# Patient Record
Sex: Male | Born: 1970
Health system: Southern US, Community
[De-identification: ages and names within clinical notes are randomized; demographics above are authoritative.]

## PROBLEM LIST (undated history)

## (undated) DIAGNOSIS — E785 Hyperlipidemia, unspecified: Secondary | ICD-10-CM

## (undated) DIAGNOSIS — E781 Pure hyperglyceridemia: Secondary | ICD-10-CM

## (undated) DIAGNOSIS — R7303 Prediabetes: Secondary | ICD-10-CM

## (undated) DIAGNOSIS — R41840 Attention and concentration deficit: Secondary | ICD-10-CM

## (undated) DIAGNOSIS — E079 Disorder of thyroid, unspecified: Secondary | ICD-10-CM

## (undated) HISTORY — DX: Pure hyperglyceridemia: E78.1

## (undated) HISTORY — DX: Attention and concentration deficit: R41.840

## (undated) HISTORY — PX: VASECTOMY: SHX75

## (undated) HISTORY — DX: Hyperlipidemia, unspecified: E78.5

## (undated) HISTORY — PX: CLOSED REDUCTION RADIAL / ULNAR SHAFT FRACTURE: SUR237

## (undated) HISTORY — DX: Prediabetes: R73.03

---

## 2012-01-27 ENCOUNTER — Ambulatory Visit (INDEPENDENT_AMBULATORY_CARE_PROVIDER_SITE_OTHER): Payer: BC Managed Care – PPO | Admitting: Family Medicine

## 2012-01-27 ENCOUNTER — Encounter: Payer: Self-pay | Admitting: Family Medicine

## 2012-01-27 VITALS — BP 120/78 | HR 82 | Ht 70.0 in | Wt 217.0 lb

## 2012-01-27 DIAGNOSIS — Z13220 Encounter for screening for lipoid disorders: Secondary | ICD-10-CM

## 2012-01-27 DIAGNOSIS — Z131 Encounter for screening for diabetes mellitus: Secondary | ICD-10-CM

## 2012-01-27 DIAGNOSIS — R41840 Attention and concentration deficit: Secondary | ICD-10-CM

## 2012-01-27 HISTORY — DX: Attention and concentration deficit: R41.840

## 2012-01-27 NOTE — Progress Notes (Signed)
CC: Ricky Ortiz is a 41 y.o. male is here for Establish Care, concerned about ADD and Labs Only   Subjective: HPI:  Patient presents to establish care and complains about concentration difficulty for the last months. Occurs at work where he often forgets majority of what was discussed during meetings and business calls. Denies any struggling with completing tasks or not keeping up her job responsibilities. Wife informs me that forgetfulness at home seems to be more common now been months ago. He has never had trouble with concentration the past.  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 So the concentration difficulties coincided with the passing of a good friend and mentor whose position the patient replaced, this death occurred around 5-6 months ago. Also has caused him to have more job responsibilities and rolls at work. He denies depression, anxiety, nor mental disturbance. He gets 7 hours of sleep a night and feels well rested in the morning, wife denies him ever stopping breathing or apnea-like symptoms. No alcohol or drug use. He has a history of thyroid dysfunction it was once overactive and then years later at the lower limit of normal. He denies low libido or erectile dysfunction  He believes been well over 2 years since he was screened for cholesterol or diabetes. He is told by his ophthalmologist that there retinal artery plaques  in his eyes. He denies shortness of breath, chest pain, orthopnea, peripheral edema, irregular heartbeat, nor calf claudication.  Review Of Systems Outlined In HPI  History reviewed. No pertinent past medical history.   Family History  Problem Relation Age of Onset  . Alcohol abuse      grandparents  . Colon cancer      grandfather  . Hypertension      grandfather     History  Substance Use Topics  . Smoking status: Never Smoker   . Smokeless tobacco: Not on file  . Alcohol Use: No     Objective: Filed Vitals:     01/27/12 1616  BP: 120/78  Pulse: 82    General: Alert and Oriented, No Acute Distress HEENT: Pupils equal, round, reactive to light. Conjunctivae clear.  Pink inferior turbinates. Neck supple without palpable lymphadenopathy nor abnormal masses. Lungs: Clear to auscultation bilaterally, no wheezing/ronchi/rales.  Comfortable work of breathing. Good air movement. Cardiac: Regular rate and rhythm. Normal S1/S2.  No murmurs, rubs, nor gallops.   Extremities: No peripheral edema.  Strong peripheral pulses.  Mental Status: No depression, anxiety, nor agitation. Skin: Warm and dry.  Assessment & Plan: Ricky Ortiz was seen today for establish care, concerned about add and labs only.  Diagnoses and associated orders for this visit:  Lack of concentration - B12 - TSH  Lipid screening - Lipid panel  Diabetes mellitus screening - Basic Metabolic Panel (BMET)    Ruling out reversible causes of concentration difficulties above. If normal will consider further cognitive testing.  He'll be scheduling a complete physical exam in excellent 2 weeks, routine blood work as above for this upcoming visit   Return in about 2 weeks (around 02/10/2012).

## 2012-02-03 LAB — BASIC METABOLIC PANEL
BUN: 13 mg/dL (ref 6–23)
Calcium: 9.6 mg/dL (ref 8.4–10.5)
Creat: 1.13 mg/dL (ref 0.50–1.35)
Glucose, Bld: 112 mg/dL — ABNORMAL HIGH (ref 70–99)
Potassium: 4.7 mEq/L (ref 3.5–5.3)

## 2012-02-03 LAB — LIPID PANEL
Cholesterol: 237 mg/dL — ABNORMAL HIGH (ref 0–200)
HDL: 34 mg/dL — ABNORMAL LOW (ref >39)
Total CHOL/HDL Ratio: 7 Ratio
Triglycerides: 257 mg/dL — ABNORMAL HIGH (ref <150)
VLDL: 51 mg/dL — ABNORMAL HIGH (ref 0–40)

## 2012-02-03 LAB — TSH: TSH: 2.04 u[IU]/mL (ref 0.350–4.500)

## 2012-02-04 ENCOUNTER — Encounter: Payer: Self-pay | Admitting: Family Medicine

## 2012-02-04 DIAGNOSIS — R7303 Prediabetes: Secondary | ICD-10-CM

## 2012-02-04 DIAGNOSIS — E781 Pure hyperglyceridemia: Secondary | ICD-10-CM | POA: Insufficient documentation

## 2012-02-04 DIAGNOSIS — E785 Hyperlipidemia, unspecified: Secondary | ICD-10-CM | POA: Insufficient documentation

## 2012-02-04 HISTORY — DX: Hyperlipidemia, unspecified: E78.5

## 2012-02-04 HISTORY — DX: Prediabetes: R73.03

## 2012-02-04 HISTORY — DX: Pure hyperglyceridemia: E78.1

## 2012-02-09 ENCOUNTER — Ambulatory Visit (INDEPENDENT_AMBULATORY_CARE_PROVIDER_SITE_OTHER): Payer: BC Managed Care – PPO | Admitting: Family Medicine

## 2012-02-09 ENCOUNTER — Encounter: Payer: Self-pay | Admitting: Family Medicine

## 2012-02-09 VITALS — BP 118/78 | HR 83 | Ht 70.0 in | Wt 219.0 lb

## 2012-02-09 DIAGNOSIS — R7303 Prediabetes: Secondary | ICD-10-CM

## 2012-02-09 DIAGNOSIS — R41840 Attention and concentration deficit: Secondary | ICD-10-CM

## 2012-02-09 DIAGNOSIS — E781 Pure hyperglyceridemia: Secondary | ICD-10-CM

## 2012-02-09 DIAGNOSIS — F988 Other specified behavioral and emotional disorders with onset usually occurring in childhood and adolescence: Secondary | ICD-10-CM

## 2012-02-09 DIAGNOSIS — E785 Hyperlipidemia, unspecified: Secondary | ICD-10-CM

## 2012-02-09 DIAGNOSIS — Z0000 Encounter for general adult medical examination without abnormal findings: Secondary | ICD-10-CM

## 2012-02-09 DIAGNOSIS — R7309 Other abnormal glucose: Secondary | ICD-10-CM

## 2012-02-09 MED ORDER — ATOMOXETINE HCL 80 MG PO CAPS: 80.0000 mg | ORAL_CAPSULE | Freq: Every day | ORAL | Status: DC

## 2012-02-09 NOTE — Progress Notes (Signed)
CC: Ricky Ortiz is a 41 y.o. male is here for Annual Exam  Colonoscopy: Grandfather with colon cancer age 75, no recent or remote rectal bleeding, we'll screen starting age 52 Prostate: Discussed screening risks/beneifts with patient on 02/09/2012. We'll consider PSA at age 13  Influenza Vaccine: Declined 02/09/2012 Pneumovax: Not indicated Td/Tdap: Received 2010 Zoster: Not indicated  Subjective: HPI:  Patient presents for complete physical exam. Past couple weeks he stressed that the cut out ice cream and fast food from his daily routine. No formal exercise program. Denies tobacco alcohol or drug use.  Denies past medical history of substance abuse or chemical dependency.  Denies anxiety, depression, paranoia.    He would like to start medication if indicated for my suspicion of adult ADD.  His son takes ADD medication, and did not have a good response with the stimulant type, patient would like to steer away from this as initial therapy. He is open to the idea of formal psychologic testing.  Review of Systems - General ROS: negative for - chills, fever, night sweats, weight gain or weight loss Ophthalmic ROS: negative for - decreased vision Psychological ROS: negative for - anxiety or depression ENT ROS: negative for - hearing change, nasal congestion, tinnitus or allergies Hematological and Lymphatic ROS: negative for - bleeding problems, bruising or swollen lymph nodes Breast ROS: negative Respiratory ROS: no cough, shortness of breath, or wheezing Cardiovascular ROS: no chest pain or dyspnea on exertion Gastrointestinal ROS: no abdominal pain, change in bowel habits, or black or bloody stools Genito-Urinary ROS: negative for - genital discharge, genital ulcers, incontinence or abnormal bleeding from genitals Musculoskeletal ROS: negative for - joint pain or muscle pain Neurological ROS: negative for - headaches or memory loss Dermatological ROS: negative for lumps, mole changes,  rash and skin lesion changes  Past Medical History  Diagnosis Date  . Lack of concentration 01/27/2012  . Hypertriglyceridemia 02/04/2012  . Pre-diabetes 02/04/2012  . Hyperlipidemia LDL goal < 160 02/04/2012     Family History  Problem Relation Age of Onset  . Alcohol abuse      grandparents  . Colon cancer      grandfather  . Hypertension      grandfather     History  Substance Use Topics  . Smoking status: Never Smoker   . Smokeless tobacco: Not on file  . Alcohol Use: No     Objective: Filed Vitals:   02/09/12 0907  BP: 118/78  Pulse: 83    General: No Acute Distress HEENT: Atraumatic, normocephalic, conjunctivae normal without scleral icterus.  No nasal discharge, hearing grossly intact, TMs with good landmarks bilaterally with no middle ear abnormalities, posterior pharynx clear without oral lesions. Neck: Supple, trachea midline, no cervical nor supraclavicular adenopathy. Pulmonary: Clear to auscultation bilaterally without wheezing, rhonchi, nor rales. Cardiac: Regular rate and rhythm.  No murmurs, rubs, nor gallops. No peripheral edema.  2+ peripheral pulses bilaterally. Abdomen: Bowel sounds normal.  No masses.  Non-tender without rebound.  Negative Murphy's sign. GU: Patient declines  MSK: Grossly intact, no signs of weakness.  Full strength throughout upper and lower extremities.  Full ROM in upper and lower extremities.  No midline spinal tenderness. Neuro: Gait unremarkable, CN II-XII grossly intact.  C5-C6 Reflex 2/4 Bilaterally, L4 Reflex 2/4 Bilaterally.  Cerebellar function intact. Skin: No rashes. Psych: Alert and oriented to person/place/time.  Thought process normal. No anxiety/depression.  Assessment & Plan: Ricky Ortiz was seen today for annual exam.  Diagnoses and associated orders  for this visit:  Annual physical exam  Lack of concentration - Ambulatory referral to Psychology  Hyperlipidemia ldl goal <  160  Pre-diabetes  Hypertriglyceridemia  Add (attention deficit disorder) - atomoxetine (STRATTERA) 80 MG capsule; Take 1 capsule (80 mg total) by mouth daily. Sample Pack    We discussed his hyperlipidemia with Framingham ten-year risk factor 6%. He would like to focus on diet and exercise interventions, reluctant to start medication. Discussed elevated fasting glucose in diet and exercise interventions to help reduce his risk of developing diabetes. Healthy lifestyle options were discussed with the patient to promote general health, handout was given again adressing these topics. He was given a sample pack of Strattera, he will return in 3-4 weeks to determine any improvement of concentration. ADD evaluation referral placed.  Return in about 4 weeks (around 03/08/2012).

## 2012-02-09 NOTE — Patient Instructions (Addendum)
Dr. Norlan Rann's General Advice Following Your Complete Physical Exam  The Benefits of Regular Exercise: Unless you suffer from an uncontrolled cardiovascular condition, studies strongly suggest that regular exercise and physical activity will add to both the quality and length of your life.  The World Health Organization recommends 150 minutes of moderate intensity aerobic activity every week.  This is best split over 3-4 days a week, and can be as simple as a brisk walk for just over 35 minutes "most days of the week".  This type of exercise has been shown to lower LDL-Cholesterol, lower average blood sugars, lower blood pressure, lower cardiovascular disease risk, improve memory, and increase one's overall sense of wellbeing.  The addition of anaerobic (or "strength training") exercises offers additional benefits including but not limited to increased metabolism, prevention of osteoporosis, and improved overall cholesterol levels.  How Can I Strive For A Low-Fat Diet?: Current guidelines recommend that 25-35 percent of your daily energy (food) intake should come from fats.  One might ask how can this be achieved without having to dissect each meal on a daily basis?  Switch to skim or 1% milk instead of whole milk.  Focus on lean meats such as ground turkey, fresh fish, baked chicken, and lean cuts of beef as your source of dietary protein.  Consume less than 300mg/day of dietary cholesterol.  Limit trans fatty acid consumption primarily by limiting synthetic trans fats such as partially hydrogenated oils (Ex: fried fast foods).  Focus efforts on reducing your intake of "solid" fats (Ex: Butter).  Substitute olive or vegetable oil for solid fats where possible.  Moderation of Salt Intake: Provided you don't carry a diagnosis of congestive heart failure nor renal failure, I recommend a daily allowance of no more than 2300 mg of salt (sodium).  Keeping under this daily goal is associated with a  decreased risk of cardiovascular events, creeping above it can lead to elevated blood pressures and increases your risk of cardiovascular events.  Milligrams (mg) of salt is listed on all nutrition labels, and your daily intake can add up faster than you think.  Most canned and frozen dinners can pack in over half your daily salt allowance in one meal.    Lifestyle Health Risks: Certain lifestyle choices carry specific health risks.  As you may already know, tobacco use has been associated with increasing one's risk of cardiovascular disease, pulmonary disease, numerous cancers, among many other issues.  What you may not know is that there are medications and nicotine replacement strategies that can more than double your chances of successfully quitting.  I would be thrilled to help manage your quitting strategy if you currently use tobacco products.  When it comes to alcohol use, I've yet to find an "ideal" daily allowance.  Provided an individual does not have a medical condition that is exacerbated by alcohol consumption, general guidelines determine "safe drinking" as no more than two standard drinks for a man or no more than one standard drink for a male per day.  However, much debate still exists on whether any amount of alcohol consumption is technically "safe".  My general advice, keep alcohol consumption to a minimum for general health promotion.  If you or others believe that alcohol, tobacco, or recreational drug use is interfering with your life, I would be happy to provide confidential counseling regarding treatment options.  General "Over The Counter" Nutrition Advice: Postmenopausal women should aim for a daily calcium intake of 1200 mg, however a significant   portion of this might already be provided by diets including milk, yogurt, cheese, and other dairy products.  Vitamin D has been shown to help preserve bone density, prevent fatigue, and has even been shown to help reduce falls in the  elderly.  Ensuring a daily intake of 800 Units of Vitamin D is a good place to start to enjoy the above benefits, we can easily check your Vitamin D level to see if you'd potentially benefit from supplementation beyond 800 Units a day.  Folic Acid intake should be of particular concern to women of childbearing age.  Daily consumption of 400-800 mcg of Folic Acid is recommended to minimize the chance of spinal cord defects in a fetus should pregnancy occur.    For many adults, accidents still remain one of the most common culprits when it comes to cause of death.  Some of the simplest but most effective preventitive habits you can adopt include regular seatbelt use, proper helmet use, securing firearms, and regularly testing your smoke and carbon monoxide detectors.  Ricky Ortiz B. Ricky Bellis DO [facility] [address], [address] [place], [place] [address] Phone: [phone]  

## 2014-12-19 ENCOUNTER — Encounter: Payer: Self-pay | Admitting: *Deleted

## 2014-12-19 ENCOUNTER — Emergency Department (INDEPENDENT_AMBULATORY_CARE_PROVIDER_SITE_OTHER): Payer: BLUE CROSS/BLUE SHIELD

## 2014-12-19 ENCOUNTER — Emergency Department (INDEPENDENT_AMBULATORY_CARE_PROVIDER_SITE_OTHER)
Admission: EM | Admit: 2014-12-19 | Discharge: 2014-12-19 | Disposition: A | Discharge status: Home / Self Care | Payer: BLUE CROSS/BLUE SHIELD | Source: Home / Self Care | Attending: Family Medicine | Admitting: Family Medicine

## 2014-12-19 DIAGNOSIS — S62396A Other fracture of fifth metacarpal bone, right hand, initial encounter for closed fracture: Secondary | ICD-10-CM

## 2014-12-19 DIAGNOSIS — X58XXXA Exposure to other specified factors, initial encounter: Secondary | ICD-10-CM | POA: Diagnosis not present

## 2014-12-19 DIAGNOSIS — S62306A Unspecified fracture of fifth metacarpal bone, right hand, initial encounter for closed fracture: Secondary | ICD-10-CM | POA: Diagnosis not present

## 2014-12-19 MED ORDER — TRAMADOL HCL 50 MG PO TABS: ORAL_TABLET | ORAL | Status: DC

## 2014-12-19 NOTE — Discharge Instructions (Signed)
Boxer's Fracture °Boxer's fracture is a broken bone (fracture) of the fourth or fifth metacarpal (ring or pinky finger). The metacarpal bones connect the wrist to the fingers and make up the arch of the hand. Boxer's fracture occurs toward the body (proximal) from the first knuckle. This injury is known as a boxer's fracture, because it often occurs from hitting an object with a closed fist. °SYMPTOMS  °· Severe pain at the time of injury. °· Pain and swelling around the first knuckle on the fourth or fifth finger. °· Bruising (contusion) in the area within 48 hours of injury. °· Visible deformity, such as a pushed down knuckle. This can occur if the fracture is complete, and the bone fragments separate enough to distort normal body shape. °· Numbness or paralysis from swelling in the hand, causing pressure on the blood vessels or nerves (uncommon). °CAUSES  °· Direct injury (trauma), such as a striking blow with the fist. °· Indirect stress to the hand, such as twisting or violent muscle contraction (uncommon). °RISK INCREASES WITH: °· Punching an object with an unprotected knuckle. °· Contact sports (football, rugby). °· Sports that require hitting (boxing, martial arts). °· History of bone or joint disease (osteoporosis). °PREVENTION °· Maintain physical fitness: °¨ Muscle strength and flexibility. °¨ Endurance. °¨ Cardiovascular fitness. °· For participation in contact sports, wear proper protective equipment for the hand and make sure it fits properly. °· Learn and use proper technique when hitting or punching. °PROGNOSIS  °When proper treatment is given, to ensure normal alignment of the bones, healing can usually be expected in 4 to 6 weeks. Occasionally, surgery is necessary.  °RELATED COMPLICATIONS  °· Bone does not heal back together (nonunion). °· Bone heals together in an improper position (malunion), causing twisting of the finger when making a fist. °· Chronic pain, stiffness, or swelling of the  hand. °· Excessive bleeding in the hand, causing pressure and injury to nerves and blood vessels (rare). °· Stopping of normal hand growth in children. °· Infection of the wound, if skin is broken over the fracture (open fracture), or at the incision site if surgery is performed. °· Shortening of injured bones. °· Bony bump (prominence) in the palm or loss of shape of the knuckles. °· Pain and weakness when gripping. °· Arthritis of the affected joint, if the fracture goes into the joint, after repeated injury, or after delayed treatment. °· Scarring around the knuckle, and limited motion. °TREATMENT  °Treatment varies, depending on the injury. The place in the hand where the injury occurs has a great deal of motion, which allows the hand to move properly. If the fracture is not aligned properly, this function may be decreased. If the bone ends are in proper alignment, treatment first involves ice and elevation of the injured hand, at or above heart level, to reduce inflammation. Pain medicines help to relieve pain. Treatment also involves restraint by splinting, bandaging, casting, or bracing for 4 or more weeks.  °If the fracture is out of alignment (displaced), or it involves the joint, surgery is usually recommended. Surgery typically involves cutting through the skin to place removable pins, screws, and sometimes plates over the fracture. After surgery, the bone and joint are restrained for 4 or more weeks. After restraint (with or without surgery), stretching and strengthening exercises are needed to regain proper strength and function of the joint. Exercises may be done at home or with the assistance of a therapist. Depending on the sport and position played, a   brace or splint may be recommended when first returning to sports.  °MEDICATION  °· If pain medication is necessary, nonsteroidal anti-inflammatory medications, such as aspirin and ibuprofen, or other minor pain relievers, such as acetaminophen, are  often recommended. °· Do not take pain medication for 7 days before surgery. °· Prescription pain relievers may be necessary. Use only as directed and only as much as you need. °COLD THERAPY °Cold treatment (icing) relieves pain and reduces inflammation. Cold treatment should be applied for 10 to 15 minutes every 2 to 3 hours for inflammation and pain, and immediately after any activity that aggravates your symptoms. Use ice packs or an ice massage. °SEEK MEDICAL CARE IF:  °· Pain, tenderness, or swelling gets worse, despite treatment. °· You experience pain, numbness, or coldness in the hand. °· Blue, gray, or dark color appears in the fingernails. °· You develop signs of infection, after surgery (fever, increased pain, swelling, redness, drainage of fluids, or bleeding in the surgical area). °· You feel you have reinjured the hand. °· New, unexplained symptoms develop. (Drugs used in treatment may produce side effects.) °Document Released: 03/03/2005 Document Revised: 05/26/2011 Document Reviewed: 06/15/2008 °ExitCare® Patient Information ©2015 [employer]. This information is not intended to replace advice given to you by your health care provider. Make sure you discuss any questions you have with your health care provider. ° °

## 2014-12-19 NOTE — Consult Note (Signed)
   Subjective:    I'm seeing this patient as a consultation for:  Dr. Donna Christen  CC: Right hand fracture  HPI: 3 days ago this pleasant 44 year old male smashed his hand on a table, he had immediate pain, swelling, bruising, he did not seek immediate medical attention however when pain persisted, he was seen by the urgent care provider, x-rays revealed and a dilated fracture of the fifth metacarpal and I was called for further evaluation and definitive management. Pain is moderate, persistent without radiation.  Past medical history, Surgical history, Family history not pertinant except as noted below, Social history, Allergies, and medications have been entered into the medical record, reviewed, and no changes needed.   Review of Systems: No headache, visual changes, nausea, vomiting, diarrhea, constipation, dizziness, abdominal pain, skin rash, fevers, chills, night sweats, weight loss, swollen lymph nodes, body aches, joint swelling, muscle aches, chest pain, shortness of breath, mood changes, visual or auditory hallucinations.   Objective:   General: Well Developed, well nourished, and in no acute distress.  Neuro/Psych: Alert and oriented x3, extra-ocular muscles intact, able to move all 4 extremities, sensation grossly intact. Skin: Warm and dry, no rashes noted.  Respiratory: Not using accessory muscles, speaking in full sentences, trachea midline.  Cardiovascular: Pulses palpable, no extremity edema. Abdomen: Does not appear distended. Right hand: Tender to palpation with obvious deformity at the neck of the fifth metacarpal, neurovascularly intact distally.  X-rays reviewed and show an angulated fracture at the neck of the fifth metacarpal.  Procedure:  Fracture Reduction   Risks, benefits, and alternatives explained and consent obtained. Time out conducted. Surface prepped with alcohol. 4 mL lidocaine, 4 mL Marcaine infiltrated in a hematoma block. Adequate anesthesia  ensured. Fracture reduction: I applied a volarly directed force from the last packed of the fracture, thus reducing the fracture, and ulnar gutter splint was then placed. Post reduction films obtained showed anatomic/near-anatomic alignment. Pt stable, aftercare and follow-up advised.  Impression and Recommendations:   This case required medical decision making of moderate complexity.  1. Angulate it fracture of the right fifth metacarpal, closed reduction as above, patient declines narcotics, he will use tramadol and Tylenol for pain. He needs to see me at the end of the week for repeat x-rays.  I billed a fracture code for this encounter, all subsequent visits will be post-op checks in the global period.  ___________________________________________ Ihor Austin. Benjamin Stain, M.D., ABFM., CAQSM. Primary Care and Sports Medicine [facility]  Adjunct Instructor of Family Medicine  [employer]

## 2014-12-19 NOTE — ED Notes (Signed)
Pt c/o RT hand pain after hitting it on a table x 3 days ago.

## 2014-12-19 NOTE — ED Provider Notes (Signed)
CSN: [id number]     Arrival date & time 12/19/14  1810 History   First MD Initiated Contact with Patient 12/19/14 1841     Chief Complaint  Patient presents with  . Hand Pain      HPI Comments: Three days ago while in [place], patient hit a table with his right hand resulting in pain, swelling, and deformity.  Patient is a 44 y.o. male presenting with hand injury. The history is provided by the patient.  Hand Injury Location:  Hand Time since incident:  3 days Injury: yes   Mechanism of injury comment:  Hit a table top Hand location:  R hand Pain details:    Quality:  Aching   Radiates to:  Does not radiate   Severity:  No pain   Onset quality:  Sudden Chronicity:  New Handedness:  Right-handed Foreign body present:  No foreign bodies Prior injury to area:  No Relieved by:  Nothing Worsened by:  Movement Ineffective treatments:  Ice Associated symptoms: swelling   Associated symptoms: no decreased range of motion, no muscle weakness, no numbness, no stiffness and no tingling     Past Medical History  Diagnosis Date  . Lack of concentration 01/27/2012  . Hypertriglyceridemia 02/04/2012  . Pre-diabetes 02/04/2012  . Hyperlipidemia LDL goal < 160 02/04/2012   Past Surgical History  Procedure Laterality Date  . Vasectomy     Family History  Problem Relation Age of Onset  . Alcohol abuse      grandparents  . Colon cancer      grandfather  . Hypertension      grandfather   Social History  Substance Use Topics  . Smoking status: Never Smoker   . Smokeless tobacco: None  . Alcohol Use: No    Review of Systems  Musculoskeletal: Negative for stiffness.  All other systems reviewed and are negative.   Allergies  Review of patient's allergies indicates no known allergies.  Home Medications   Prior to Admission medications   Medication Sig Start Date End Date Taking? Authorizing Provider  atomoxetine (STRATTERA) 80 MG capsule Take 1 capsule (80 mg total) by  mouth daily. Sample Pack 02/09/12 02/08/13  Laren Boom, DO   Meds Ordered and Administered this Visit  Medications - No data to display  BP 135/89 mmHg  Pulse 93  Temp(Src) 98.1 F (36.7 C) (Oral)  Resp 18  Ht  (1.778 m)  Wt 226 lb (102.513 kg)  BMI 32.43 kg/m2  SpO2 96% No data found.   Physical Exam  Constitutional: He is oriented to person, place, and time. He appears well-developed and well-nourished. No distress.  HENT:  Head: Atraumatic.  Eyes: Pupils are equal, round, and reactive to light.  Pulmonary/Chest: No respiratory distress.  Musculoskeletal:       Right hand: He exhibits tenderness, bony tenderness, deformity and swelling. He exhibits normal range of motion, normal two-point discrimination, normal capillary refill and no laceration. Normal sensation noted. Normal strength noted.       Hands: There is swelling, deformity, and tenderness to palpation over the right fifth distal metacarpal and MCP joint.  Neurological: He is alert and oriented to person, place, and time.  Skin: Skin is warm and dry.  Nursing note and vitals reviewed.   ED Course  Procedures  None   Imaging Review Dg Hand Complete Right  12/19/2014   CLINICAL DATA:  Status post injury to the right hand 4 days ago with pain.  EXAM: RIGHT  HAND - COMPLETE 3+ VIEW  COMPARISON:  None.  FINDINGS: There is comminuted displaced fracture of the distal fifth metacarpal with volar angulation of distal fragment. There is no dislocation.  IMPRESSION: Fracture of fifth metacarpal.   Electronically Signed   By: Sherian Rein M.D.   On: 12/19/2014 18:44     MDM   1. Fracture of fifth metacarpal bone of right hand, closed, initial encounter    Discussed with Dr. Rodney Langton who will assume care for fracture reduction, immobilization, and management    Lattie Haw, MD 12/19/14 [phone]

## 2014-12-22 ENCOUNTER — Ambulatory Visit (INDEPENDENT_AMBULATORY_CARE_PROVIDER_SITE_OTHER): Payer: BLUE CROSS/BLUE SHIELD

## 2014-12-22 ENCOUNTER — Encounter: Payer: Self-pay | Admitting: Sports Medicine

## 2014-12-22 ENCOUNTER — Ambulatory Visit (INDEPENDENT_AMBULATORY_CARE_PROVIDER_SITE_OTHER): Payer: BLUE CROSS/BLUE SHIELD | Admitting: Sports Medicine

## 2014-12-22 VITALS — BP 138/89 | HR 86 | Ht 70.0 in | Wt 226.0 lb

## 2014-12-22 DIAGNOSIS — S62396D Other fracture of fifth metacarpal bone, right hand, subsequent encounter for fracture with routine healing: Secondary | ICD-10-CM

## 2014-12-22 DIAGNOSIS — X58XXXD Exposure to other specified factors, subsequent encounter: Secondary | ICD-10-CM | POA: Diagnosis not present

## 2014-12-22 DIAGNOSIS — S62306A Unspecified fracture of fifth metacarpal bone, right hand, initial encounter for closed fracture: Secondary | ICD-10-CM | POA: Insufficient documentation

## 2014-12-22 DIAGNOSIS — S62306D Unspecified fracture of fifth metacarpal bone, right hand, subsequent encounter for fracture with routine healing: Secondary | ICD-10-CM

## 2014-12-22 NOTE — Progress Notes (Signed)
  Subjective: Ricky Ortiz returns, I performed a closed reduction of an angulated boxer's fracture 3 days ago, he returns here today doing well in the splint, almost pain-free   Objective: General: Well-developed, well-nourished, and in no acute distress. Right hand: Good in the splint, no evidence of skin breakdown. Minimally tender over the fracture.  X-rays personally reviewed, there has been approximately 2 mm loss in length of the fifth metacarpal however there is 0 angulation.  Assessment/plan:

## 2014-12-22 NOTE — Assessment & Plan Note (Signed)
Doing well, good maintained reduction after closed reduction 2 days ago. Continue ulnar gutter splint. Return to see me in one, x-ray before visit.

## 2014-12-29 ENCOUNTER — Ambulatory Visit (INDEPENDENT_AMBULATORY_CARE_PROVIDER_SITE_OTHER): Payer: BLUE CROSS/BLUE SHIELD | Admitting: Sports Medicine

## 2014-12-29 ENCOUNTER — Encounter: Payer: Self-pay | Admitting: Sports Medicine

## 2014-12-29 ENCOUNTER — Ambulatory Visit (INDEPENDENT_AMBULATORY_CARE_PROVIDER_SITE_OTHER): Payer: BLUE CROSS/BLUE SHIELD

## 2014-12-29 DIAGNOSIS — M25741 Osteophyte, right hand: Secondary | ICD-10-CM

## 2014-12-29 DIAGNOSIS — S62306D Unspecified fracture of fifth metacarpal bone, right hand, subsequent encounter for fracture with routine healing: Secondary | ICD-10-CM

## 2014-12-29 DIAGNOSIS — X58XXXD Exposure to other specified factors, subsequent encounter: Secondary | ICD-10-CM

## 2014-12-29 NOTE — Progress Notes (Signed)
  Subjective: 1 week post closed reduction of a right fifth metacarpal fracture, doing well.   Objective: General: Well-developed, well-nourished, and in no acute distress. Right hand: Splint is removed, minimal tenderness over the fracture, neurovascularly intact distally.  Exos cast placed.  X-rays personally reviewed, they are stable with signs of new bone callus, there is no angulation, there is only minimal if any displacement, there has been only a couple millimeter loss of height of the fracture.  Assessment/plan:

## 2014-12-29 NOTE — Assessment & Plan Note (Signed)
Recheck fracture in 3 weeks, x-ray before visit.

## 2015-01-19 ENCOUNTER — Ambulatory Visit: Payer: BLUE CROSS/BLUE SHIELD | Admitting: Sports Medicine

## 2015-01-26 ENCOUNTER — Encounter: Payer: Self-pay | Admitting: Sports Medicine

## 2015-01-26 ENCOUNTER — Ambulatory Visit (INDEPENDENT_AMBULATORY_CARE_PROVIDER_SITE_OTHER): Payer: BLUE CROSS/BLUE SHIELD

## 2015-01-26 ENCOUNTER — Ambulatory Visit (INDEPENDENT_AMBULATORY_CARE_PROVIDER_SITE_OTHER): Payer: BLUE CROSS/BLUE SHIELD | Admitting: Sports Medicine

## 2015-01-26 VITALS — BP 118/86 | HR 95 | Wt 225.0 lb

## 2015-01-26 DIAGNOSIS — S62306D Unspecified fracture of fifth metacarpal bone, right hand, subsequent encounter for fracture with routine healing: Secondary | ICD-10-CM

## 2015-01-26 DIAGNOSIS — X58XXXD Exposure to other specified factors, subsequent encounter: Secondary | ICD-10-CM

## 2015-01-26 DIAGNOSIS — S62397D Other fracture of fifth metacarpal bone, left hand, subsequent encounter for fracture with routine healing: Secondary | ICD-10-CM | POA: Diagnosis not present

## 2015-01-26 NOTE — Progress Notes (Signed)
  Subjective: 4 weeks post fracture of the fifth metacarpal neck on the right, doing well with Exos cast . Pain-free.   Objective: General: Well-developed, well-nourished, and in no acute distress. Right hand: Palpable bony callus, there is minimal depression of the fifth metacarpal head, he has full range of motion, good strength and no rotational deformity, there is no tenderness over the fracture.  X-rays reviewed and show good bony callus, alignment is stable, there is only approximately 2 mm loss of height of the fifth metacarpal head.  Assessment/plan:

## 2015-01-26 NOTE — Assessment & Plan Note (Signed)
Doing extremely well four weeks post fracture. Discontinue cast, return as needed.

## 2015-09-04 ENCOUNTER — Encounter: Payer: Self-pay | Admitting: Sports Medicine

## 2015-09-04 ENCOUNTER — Ambulatory Visit (INDEPENDENT_AMBULATORY_CARE_PROVIDER_SITE_OTHER): Payer: BLUE CROSS/BLUE SHIELD | Admitting: Sports Medicine

## 2015-09-04 VITALS — BP 135/87 | HR 88 | Wt 228.0 lb

## 2015-09-04 DIAGNOSIS — M778 Other enthesopathies, not elsewhere classified: Secondary | ICD-10-CM | POA: Insufficient documentation

## 2015-09-04 DIAGNOSIS — M6588 Other synovitis and tenosynovitis, other site: Secondary | ICD-10-CM | POA: Diagnosis not present

## 2015-09-04 DIAGNOSIS — M779 Enthesopathy, unspecified: Principal | ICD-10-CM

## 2015-09-04 NOTE — Progress Notes (Signed)
   Subjective:    I'm seeing this patient as a consultation for:  Dr. Laren BoomSean Hommel  CC: Right hand pain  HPI: For the past several weeks this pleasant 45 year old male has had pain that he localizes in the palm of his right hand, moderate, persistent, worse with gripping and pressure. No radiation, no numbness or tingling, no trauma. He did have a fifth metacarpal fracture that has healed well.  Past medical history, Surgical history, Family history not pertinant except as noted below, Social history, Allergies, and medications have been entered into the medical record, reviewed, and no changes needed.   Review of Systems: No headache, visual changes, nausea, vomiting, diarrhea, constipation, dizziness, abdominal pain, skin rash, fevers, chills, night sweats, weight loss, swollen lymph nodes, body aches, joint swelling, muscle aches, chest pain, shortness of breath, mood changes, visual or auditory hallucinations.   Objective:   General: Well Developed, well nourished, and in no acute distress.  Neuro/Psych: Alert and oriented x3, extra-ocular muscles intact, able to move all 4 extremities, sensation grossly intact. Skin: Warm and dry, no rashes noted.  Respiratory: Not using accessory muscles, speaking in full sentences, trachea midline.  Cardiovascular: Pulses palpable, no extremity edema. Abdomen: Does not appear distended. Right hand: Tender to palpation at the third flexor digitorum tendon.  Procedure: Real-time Ultrasound Guided Injection of right third flexor digitorum tendon sheath Device: GE Logiq E  Verbal informed consent obtained.  Time-out conducted.  Noted no overlying erythema, induration, or other signs of local infection.  Skin prepped in a sterile fashion.  Local anesthesia: Topical Ethyl chloride.  With sterile technique and under real time ultrasound guidance:  Using a 30-gauge needle I injected 1/2 mL lidocaine, 1/2 mL kenalog 40. Completed without difficulty  Pain  immediately resolved suggesting accurate placement of the medication.  Advised to call if fevers/chills, erythema, induration, drainage, or persistent bleeding.  Images permanently stored and available for review in the ultrasound unit.  Impression: Technically successful ultrasound guided injection.  Impression and Recommendations:   This case required medical decision making of moderate complexity.

## 2015-09-04 NOTE — Assessment & Plan Note (Signed)
Right third flexor digitorum tendinitis, injection placed the tendon sheath, no triggering. Return in one month.

## 2015-10-02 ENCOUNTER — Ambulatory Visit (INDEPENDENT_AMBULATORY_CARE_PROVIDER_SITE_OTHER): Payer: BLUE CROSS/BLUE SHIELD | Admitting: Sports Medicine

## 2015-10-02 ENCOUNTER — Encounter: Payer: Self-pay | Admitting: Sports Medicine

## 2015-10-02 VITALS — BP 118/87 | HR 103 | Wt 229.5 lb

## 2015-10-02 DIAGNOSIS — M779 Enthesopathy, unspecified: Principal | ICD-10-CM

## 2015-10-02 DIAGNOSIS — M6588 Other synovitis and tenosynovitis, other site: Secondary | ICD-10-CM | POA: Diagnosis not present

## 2015-10-02 DIAGNOSIS — M778 Other enthesopathies, not elsewhere classified: Secondary | ICD-10-CM

## 2015-10-02 NOTE — Progress Notes (Signed)
  Subjective:    CC: Follow-up  HPI: Pain-free after her right third flexor tendon sheath injection in the hand, no complaints.  Past medical history, Surgical history, Family history not pertinant except as noted below, Social history, Allergies, and medications have been entered into the medical record, reviewed, and no changes needed.   Review of Systems: No fevers, chills, night sweats, weight loss, chest pain, or shortness of breath.   Objective:    General: Well Developed, well nourished, and in no acute distress.  Neuro: Alert and oriented x3, extra-ocular muscles intact, sensation grossly intact.  HEENT: Normocephalic, atraumatic, pupils equal round reactive to light, neck supple, no masses, no lymphadenopathy, thyroid nonpalpable.  Skin: Warm and dry, no rashes. Cardiac: Regular rate and rhythm, no murmurs rubs or gallops, no lower extremity edema.  Respiratory: Clear to auscultation bilaterally. Not using accessory muscles, speaking in full sentences.  Impression and Recommendations:

## 2015-10-02 NOTE — Assessment & Plan Note (Signed)
Pain-free after her right third flexor tendon sheath injection in the hand, no complaints.

## 2016-01-14 ENCOUNTER — Ambulatory Visit (INDEPENDENT_AMBULATORY_CARE_PROVIDER_SITE_OTHER): Payer: BLUE CROSS/BLUE SHIELD | Admitting: Sports Medicine

## 2016-01-14 ENCOUNTER — Encounter: Payer: Self-pay | Admitting: Sports Medicine

## 2016-01-14 DIAGNOSIS — Z0000 Encounter for general adult medical examination without abnormal findings: Secondary | ICD-10-CM

## 2016-01-14 DIAGNOSIS — R7989 Other specified abnormal findings of blood chemistry: Secondary | ICD-10-CM

## 2016-01-14 DIAGNOSIS — R7303 Prediabetes: Secondary | ICD-10-CM

## 2016-01-14 DIAGNOSIS — E0590 Thyrotoxicosis, unspecified without thyrotoxic crisis or storm: Secondary | ICD-10-CM

## 2016-01-14 DIAGNOSIS — E781 Pure hyperglyceridemia: Secondary | ICD-10-CM

## 2016-01-14 DIAGNOSIS — R946 Abnormal results of thyroid function studies: Secondary | ICD-10-CM

## 2016-01-14 NOTE — Assessment & Plan Note (Signed)
Rechecking A1c. 

## 2016-01-14 NOTE — Progress Notes (Signed)
  Subjective:    CC: Complete physical  HPI:  This is a pleasant 45 year old male with high cholesterol, prediabetes. It's been a long time since he's had blood work and he needs a physical today for employer requirements. Declines influenza vaccination  Past medical history:  Negative.  See flowsheet/record as well for more information.  Surgical history: Negative.  See flowsheet/record as well for more information.  Family history: Negative.  See flowsheet/record as well for more information.  Social history: Negative.  See flowsheet/record as well for more information.  Allergies, and medications have been entered into the medical record, reviewed, and no changes needed.    Review of Systems: No headache, visual changes, nausea, vomiting, diarrhea, constipation, dizziness, abdominal pain, skin rash, fevers, chills, night sweats, swollen lymph nodes, weight loss, chest pain, body aches, joint swelling, muscle aches, shortness of breath, mood changes, visual or auditory hallucinations.  Objective:    General: Well Developed, well nourished, and in no acute distress.  Neuro: Alert and oriented x3, extra-ocular muscles intact, sensation grossly intact. Cranial nerves II through XII are intact, motor, sensory, and coordinative functions are all intact. HEENT: Normocephalic, atraumatic, pupils equal round reactive to light, neck supple, no masses, no lymphadenopathy, thyroid nonpalpable. Oropharynx, nasopharynx, external ear canals are unremarkable. Skin: Warm and dry, no rashes noted.  Cardiac: Regular rate and rhythm, no murmurs rubs or gallops.  Respiratory: Clear to auscultation bilaterally. Not using accessory muscles, speaking in full sentences.  Abdominal: Soft, nontender, nondistended, positive bowel sounds, no masses, no organomegaly.  Musculoskeletal: Shoulder, elbow, wrist, hip, knee, ankle stable, and with full range of motion.  Impression and Recommendations:    The patient was  counselled, risk factors were discussed, anticipatory guidance given.  Annual physical exam Complete physical as above. Checking testosterone levels. Return in one year, he will establish with one of our other primary providers  Pre-diabetes Rechecking A1c  Hypertriglyceridemia Rechecking lipids

## 2016-01-14 NOTE — Assessment & Plan Note (Signed)
Complete physical as above. Checking testosterone levels. Return in one year, he will establish with one of our other primary providers

## 2016-01-14 NOTE — Assessment & Plan Note (Addendum)
Rechecking lipids.  Cholesterol continues to be unacceptably high, we will continue to work on a low cholesterol diet, exercise, and when the patient is ready we can start the medication.

## 2016-01-30 LAB — CBC
HCT: 43.4 % (ref 38.5–50.0)
Hemoglobin: 14.9 g/dL (ref 13.2–17.1)
MCH: 28.9 pg (ref 27.0–33.0)
MCHC: 34.3 g/dL (ref 32.0–36.0)
MCV: 84.3 fL (ref 80.0–100.0)
MPV: 12.5 fL (ref 7.5–12.5)
Platelets: 197 10*3/uL (ref 140–400)
RBC: 5.15 MIL/uL (ref 4.20–5.80)
RDW: 13.4 % (ref 11.0–15.0)
WBC: 8.1 10*3/uL (ref 3.8–10.8)

## 2016-01-31 DIAGNOSIS — E0500 Thyrotoxicosis with diffuse goiter without thyrotoxic crisis or storm: Secondary | ICD-10-CM | POA: Insufficient documentation

## 2016-01-31 LAB — COMPREHENSIVE METABOLIC PANEL
ALT: 47 U/L — ABNORMAL HIGH (ref 9–46)
AST: 21 U/L (ref 10–40)
Alkaline Phosphatase: 58 U/L (ref 40–115)
BUN: 18 mg/dL (ref 7–25)
Creat: 0.87 mg/dL (ref 0.60–1.35)
Glucose, Bld: 122 mg/dL — ABNORMAL HIGH (ref 65–99)
Potassium: 4.5 mmol/L (ref 3.5–5.3)
Sodium: 138 mmol/L (ref 135–146)
Total Bilirubin: 0.4 mg/dL (ref 0.2–1.2)
Total Protein: 6.8 g/dL (ref 6.1–8.1)

## 2016-01-31 LAB — TESTOSTERONE TOTAL,FREE,BIO, MALES
Albumin: 4.2 g/dL (ref 3.6–5.1)
Sex Hormone Binding: 25 nmol/L (ref 10–50)
Testosterone, Bioavailable: 155.2 ng/dL (ref 110.0–575.0)
Testosterone, Free: 80.6 pg/mL (ref 46.0–224.0)
Testosterone: 470 ng/dL (ref 250–827)

## 2016-01-31 LAB — LIPID PANEL
Cholesterol: 219 mg/dL — ABNORMAL HIGH (ref <200)
HDL: 31 mg/dL — ABNORMAL LOW (ref >40)
LDL Cholesterol: 149 mg/dL — ABNORMAL HIGH (ref <100)
Total CHOL/HDL Ratio: 7.1 ratio — ABNORMAL HIGH (ref <5.0)
Triglycerides: 194 mg/dL — ABNORMAL HIGH (ref <150)
VLDL: 39 mg/dL — ABNORMAL HIGH (ref <30)

## 2016-01-31 LAB — VITAMIN D 25 HYDROXY (VIT D DEFICIENCY, FRACTURES): Vit D, 25-Hydroxy: 17 ng/mL — ABNORMAL LOW (ref 30–100)

## 2016-01-31 LAB — COMPREHENSIVE METABOLIC PANEL WITH GFR
Albumin: 4.2 g/dL (ref 3.6–5.1)
CO2: 24 mmol/L (ref 20–31)
Calcium: 9.6 mg/dL (ref 8.6–10.3)
Chloride: 106 mmol/L (ref 98–110)

## 2016-01-31 LAB — TSH: TSH: <0.01 mIU/L — ABNORMAL LOW (ref 0.40–4.50)

## 2016-01-31 LAB — HIV ANTIBODY (ROUTINE TESTING W REFLEX): HIV 1&2 Ab, 4th Generation: NONREACTIVE

## 2016-01-31 LAB — HEMOGLOBIN A1C
Hgb A1c MFr Bld: 5.9 % — ABNORMAL HIGH (ref <5.7)
Mean Plasma Glucose: 123 mg/dL

## 2016-01-31 MED ORDER — VITAMIN D (ERGOCALCIFEROL) 1.25 MG (50000 UNIT) PO CAPS: 50000.0000 [IU] | ORAL_CAPSULE | ORAL | 0 refills | Status: DC

## 2016-01-31 NOTE — Assessment & Plan Note (Addendum)
Undetectably low. Repeating TSH, T3, T4, also checking thyroglobulin and anti-thyroid antibody levels.  Suspect Graves' disease, referral to endocrinology

## 2016-01-31 NOTE — Addendum Note (Signed)
Addended by: Monica BectonHEKKEKANDAM, THOMAS J on: 01/31/2016 08:59 AM   Modules accepted: Orders

## 2016-02-13 LAB — THYROGLOBULIN ANTIBODY PANEL
Thyroglobulin Ab: 572 IU/mL — ABNORMAL HIGH (ref <2)
Thyroglobulin: 1.1 ng/mL — ABNORMAL LOW
Thyroperoxidase Ab SerPl-aCnc: >900 [IU]/mL — ABNORMAL HIGH (ref <9)

## 2016-02-13 LAB — TSH: TSH: <0.01 mIU/L — ABNORMAL LOW (ref 0.40–4.50)

## 2016-02-13 LAB — T4, FREE: Free T4: 1.9 ng/dL — ABNORMAL HIGH (ref 0.8–1.8)

## 2016-02-13 LAB — T3, FREE: T3, Free: 6.5 pg/mL — ABNORMAL HIGH (ref 2.3–4.2)

## 2016-02-13 NOTE — Addendum Note (Signed)
Addended by: Monica BectonHEKKEKANDAM, THOMAS J on: 02/13/2016 12:20 PM   Modules accepted: Orders

## 2016-02-14 LAB — THYROID STIMULATING IMMUNOGLOBULIN: TSI: <89 %{baseline} (ref <140)

## 2016-02-28 ENCOUNTER — Encounter: Payer: Self-pay | Admitting: Endocrinology

## 2016-02-28 ENCOUNTER — Ambulatory Visit (INDEPENDENT_AMBULATORY_CARE_PROVIDER_SITE_OTHER): Payer: BLUE CROSS/BLUE SHIELD | Admitting: Endocrinology

## 2016-02-28 VITALS — BP 138/86 | HR 94 | Ht 70.0 in | Wt 230.0 lb

## 2016-02-28 DIAGNOSIS — E0590 Thyrotoxicosis, unspecified without thyrotoxic crisis or storm: Secondary | ICD-10-CM | POA: Diagnosis not present

## 2016-02-28 NOTE — Progress Notes (Signed)
Subjective:    Patient ID: Ricky Ortiz, male    DOB: 21-Jan-1971, 44 y.o.   MRN: [id number]  HPI Pt is referred by Dr Benjamin Stain, for hyperthyroidism.  Pt reports he was dx'ed with hyperthyroidism in the 1990's, but he took medication for just a brief time, then stopped.  TSH was normal in 2013.  TFT was then found to be abnormal on a routine physical.  He says it retrospect, he has slight tremor of the hands, and assoc anxiety.  He has never had XRT to the anterior neck, or thyroid surgery.  He has never had thyroid imaging.  He does not consume kelp or any other prescribed or non-prescribed thyroid medication.  He has never been on amiodarone.   Past Medical History:  Diagnosis Date  . Hyperlipidemia LDL goal < 160 02/04/2012  . Hypertriglyceridemia 02/04/2012  . Lack of concentration 01/27/2012  . Pre-diabetes 02/04/2012    Past Surgical History:  Procedure Laterality Date  . VASECTOMY      Social History   Social History  . Marital status: Married    Spouse name: N/A  . Number of children: N/A  . Years of education: N/A   Occupational History  . Not on file.   Social History Main Topics  . Smoking status: Never Smoker  . Smokeless tobacco: Not on file  . Alcohol use No  . Drug use: No  . Sexual activity: Not on file   Other Topics Concern  . Not on file   Social History Narrative  . No narrative on file    No current outpatient prescriptions on file prior to visit.   No current facility-administered medications on file prior to visit.     No Known Allergies  Family History  Problem Relation Age of Onset  . Alcohol abuse      grandparents  . Colon cancer      grandfather  . Hypertension      grandfather  . Hypothyroidism Mother     BP 138/86   Pulse 94   Ht 5\' 10"  (1.778 m)   Wt 230 lb (104.3 kg)   SpO2 96%   BMI 33.00 kg/m     Review of Systems denies weight loss, headache, hoarseness, diplopia, diarrhea, polyuria, excessive diaphoresis,  easy bruising, and rhinorrhea.  Her has difficulty with concentration, muscle weakness, doe, palpitations, and heat intolerance.      Objective:   Physical Exam VS: see vs page GEN: no distress HEAD: head: no deformity eyes: no periorbital swelling, no proptosis external nose and ears are normal mouth: no lesion seen NECK: supple, thyroid is not enlarged CHEST WALL: no deformity LUNGS: clear to auscultation CV: reg rate and rhythm, no murmur ABD: abdomen is soft, nontender.  no hepatosplenomegaly.  not distended.  no hernia MUSCULOSKELETAL: muscle bulk and strength are grossly normal.  no obvious joint swelling.  gait is normal and steady EXTEMITIES: no deformity.  no edema PULSES: no carotid bruit NEURO:  cn 2-12 grossly intact.   readily moves all 4's.  sensation is intact to touch on all 4's.  Slight fine tremor of the hands.   SKIN:  Normal texture and temperature.  No rash or suspicious lesion is visible.   NODES:  None palpable at the neck PSYCH: alert, well-oriented.  Does not appear anxious nor depressed.    I have reviewed outside records, and summarized: Pt was noted to have hyperthyroidism, and referred here.  He was noted to be feeling  well in general.  He was seen for wellness visit.    Lab Results  Component Value Date   TSH <0.01 (L) 02/12/2016      Assessment & Plan:  Hyperthyroidism, due to Grave's Dz, new Patient is advised the following:  Patient Instructions  Please consider the treatment options we discussed, and let me know.       Hyperthyroidism Hyperthyroidism is when the thyroid is too active (overactive). Your thyroid is a large gland that is located in your neck. The thyroid helps to control how your body uses food (metabolism). When your thyroid is overactive, it produces too much of a hormone called thyroxine. What are the causes? Causes of hyperthyroidism may include:  Graves disease. This is when your immune system attacks the thyroid  gland. This is the most common cause.  Inflammation of the thyroid gland.  Tumor in the thyroid gland or somewhere else.  Excessive use of thyroid medicines, including:  Prescription thyroid supplement.  Herbal supplements that mimic thyroid hormones.  Solid or fluid-filled lumps within your thyroid gland (thyroid nodules).  Excessive ingestion of iodine. What increases the risk?  Being male.  Having a family history of thyroid conditions. What are the signs or symptoms? Signs and symptoms of hyperthyroidism may include:  Nervousness.  Inability to tolerate heat.  Unexplained weight loss.  Diarrhea.  Change in the texture of hair or skin.  Heart skipping beats or making extra beats.  Rapid heart rate.  Loss of menstruation.  Shaky hands.  Fatigue.  Restlessness.  Increased appetite.  Sleep problems.  Enlarged thyroid gland or nodules. How is this diagnosed? Diagnosis of hyperthyroidism may include:  Medical history and physical exam.  Blood tests.  Ultrasound tests. How is this treated? Treatment may include:  Medicines to control your thyroid.  Surgery to remove your thyroid.  Radiation therapy. Follow these instructions at home:  Take medicines only as directed by your health care provider.  Do not use any tobacco products, including cigarettes, chewing tobacco, or electronic cigarettes. If you need help quitting, ask your health care provider.  Do not exercise or do physical activity until your health care provider approves.  Keep all follow-up appointments as directed by your health care provider. This is important. Contact a health care provider if:  Your symptoms do not get better with treatment.  You have fever.  You are taking thyroid replacement medicine and you:  Have depression.  Feel mentally and physically slow.  Have weight gain. Get help right away if:  You have decreased alertness or a change in your  awareness.  You have abdominal pain.  You feel dizzy.  You have a rapid heartbeat.  You have an irregular heartbeat. This information is not intended to replace advice given to you by your health care provider. Make sure you discuss any questions you have with your health care provider. Document Released: 03/03/2005 Document Revised: 08/02/2015 Document Reviewed: 07/19/2013 Elsevier Interactive Patient Education  2017 [employer]  Radioiodine (I-131) Therapy for Hyperthyroidism Radioiodine (I-131) therapy is a procedure to treat an overactive thyroid gland (hyperthyroidism). The thyroid is a gland in the neck that uses iodine to help control how the body uses food (metabolism). In this procedure, you swallow a pill or liquid that contains I-131. I-131 is manufactured (synthetic) iodine that gives off radiation. This destroys thyroid cells and reverses hyperthyroidism. Tell a health care provider about:  Any allergies you have.  All medicines you are taking, including vitamins, herbs,  eye drops, creams, and over-the-counter medicines.  Any problems you or family members have had with anesthetic medicines.  Any blood disorders you have.  Any surgeries you have had.  Any medical conditions you have.  Whether you are pregnant, may be pregnant, or have gone through menopause, if this applies.  Whether you currently have children.  Whether you plan to have children in the next 2 years.  Any contact you have with children or pregnant women.  Your travel plans for the next 3 months.  Whether you pass through radiation detectors for work or travel. What are the risks? Generally, this is a safe procedure. However, problems may occur, including:  Damage to other structures or organs, such as the salivary glands. This could lead to dry mouth and loss of taste.  Low sperm count, if this applies. This may lead to temporary infertility.  Sore throat or neck pain. This is  temporary.  Slightlyincreased risk of thyroid cancer.  Nausea or vomiting. What happens before the procedure?  Ask your health care provider about changing or stopping your regular medicines. This is especially important if you are taking diabetes medicines, blood thinners, or thyroid medicines.  Women may be asked to take a pregnancy test.  Women who are breastfeeding should plan to stop at least 6 weeks before the procedure.  Follow instructions from your health care provider about eating or drinking restrictions.  Plan to avoid contact with others for 1 week after your treatment. It is most important to avoid contact with children and pregnant women. To do this, plan to stay home from work, arrange child care, and sleep alone, if these things apply to you.  Plan to drive yourself home after treatment. Do not take public transportation. If you need someone to drive you home, sit as far away from the driver as possible. What happens during the procedure?  You will be given a dose of I-131 to swallow. It may be a pill or a liquid.  Your thyroid gland will absorb the I-131 over the next 3 months. The treatment process will be complete in about 6 months. What happens after the procedure?  You may need to stay in the hospital for 24 hours after your treatment. This depends on the requirements in your state.  Follow instructions from your health care provider about:  How to take care of yourself after the procedure.  How to protect others from exposure to radiation as it leaves your body. This information is not intended to replace advice given to you by your health care provider. Make sure you discuss any questions you have with your health care provider. Document Released: 07/20/2008 Document Revised: 08/07/2015 Document Reviewed: 06/28/2014 Elsevier Interactive Patient Education  2017 [employer]

## 2016-02-28 NOTE — Patient Instructions (Addendum)
Please consider the treatment options we discussed, and let me know.       Hyperthyroidism Hyperthyroidism is when the thyroid is too active (overactive). Your thyroid is a large gland that is located in your neck. The thyroid helps to control how your body uses food (metabolism). When your thyroid is overactive, it produces too much of a hormone called thyroxine. What are the causes? Causes of hyperthyroidism may include:  Graves disease. This is when your immune system attacks the thyroid gland. This is the most common cause.  Inflammation of the thyroid gland.  Tumor in the thyroid gland or somewhere else.  Excessive use of thyroid medicines, including:  Prescription thyroid supplement.  Herbal supplements that mimic thyroid hormones.  Solid or fluid-filled lumps within your thyroid gland (thyroid nodules).  Excessive ingestion of iodine. What increases the risk?  Being male.  Having a family history of thyroid conditions. What are the signs or symptoms? Signs and symptoms of hyperthyroidism may include:  Nervousness.  Inability to tolerate heat.  Unexplained weight loss.  Diarrhea.  Change in the texture of hair or skin.  Heart skipping beats or making extra beats.  Rapid heart rate.  Loss of menstruation.  Shaky hands.  Fatigue.  Restlessness.  Increased appetite.  Sleep problems.  Enlarged thyroid gland or nodules. How is this diagnosed? Diagnosis of hyperthyroidism may include:  Medical history and physical exam.  Blood tests.  Ultrasound tests. How is this treated? Treatment may include:  Medicines to control your thyroid.  Surgery to remove your thyroid.  Radiation therapy. Follow these instructions at home:  Take medicines only as directed by your health care provider.  Do not use any tobacco products, including cigarettes, chewing tobacco, or electronic cigarettes. If you need help quitting, ask your health care  provider.  Do not exercise or do physical activity until your health care provider approves.  Keep all follow-up appointments as directed by your health care provider. This is important. Contact a health care provider if:  Your symptoms do not get better with treatment.  You have fever.  You are taking thyroid replacement medicine and you:  Have depression.  Feel mentally and physically slow.  Have weight gain. Get help right away if:  You have decreased alertness or a change in your awareness.  You have abdominal pain.  You feel dizzy.  You have a rapid heartbeat.  You have an irregular heartbeat. This information is not intended to replace advice given to you by your health care provider. Make sure you discuss any questions you have with your health care provider. Document Released: 03/03/2005 Document Revised: 08/02/2015 Document Reviewed: 07/19/2013 Elsevier Interactive Patient Education  2017 [employer]  Radioiodine (I-131) Therapy for Hyperthyroidism Radioiodine (I-131) therapy is a procedure to treat an overactive thyroid gland (hyperthyroidism). The thyroid is a gland in the neck that uses iodine to help control how the body uses food (metabolism). In this procedure, you swallow a pill or liquid that contains I-131. I-131 is manufactured (synthetic) iodine that gives off radiation. This destroys thyroid cells and reverses hyperthyroidism. Tell a health care provider about:  Any allergies you have.  All medicines you are taking, including vitamins, herbs, eye drops, creams, and over-the-counter medicines.  Any problems you or family members have had with anesthetic medicines.  Any blood disorders you have.  Any surgeries you have had.  Any medical conditions you have.  Whether you are pregnant, may be pregnant, or have gone through menopause, if  this applies.  Whether you currently have children.  Whether you plan to have children in the next 2  years.  Any contact you have with children or pregnant women.  Your travel plans for the next 3 months.  Whether you pass through radiation detectors for work or travel. What are the risks? Generally, this is a safe procedure. However, problems may occur, including:  Damage to other structures or organs, such as the salivary glands. This could lead to dry mouth and loss of taste.  Low sperm count, if this applies. This may lead to temporary infertility.  Sore throat or neck pain. This is temporary.  Slightlyincreased risk of thyroid cancer.  Nausea or vomiting. What happens before the procedure?  Ask your health care provider about changing or stopping your regular medicines. This is especially important if you are taking diabetes medicines, blood thinners, or thyroid medicines.  Women may be asked to take a pregnancy test.  Women who are breastfeeding should plan to stop at least 6 weeks before the procedure.  Follow instructions from your health care provider about eating or drinking restrictions.  Plan to avoid contact with others for 1 week after your treatment. It is most important to avoid contact with children and pregnant women. To do this, plan to stay home from work, arrange child care, and sleep alone, if these things apply to you.  Plan to drive yourself home after treatment. Do not take public transportation. If you need someone to drive you home, sit as far away from the driver as possible. What happens during the procedure?  You will be given a dose of I-131 to swallow. It may be a pill or a liquid.  Your thyroid gland will absorb the I-131 over the next 3 months. The treatment process will be complete in about 6 months. What happens after the procedure?  You may need to stay in the hospital for 24 hours after your treatment. This depends on the requirements in your state.  Follow instructions from your health care provider about:  How to take care of  yourself after the procedure.  How to protect others from exposure to radiation as it leaves your body. This information is not intended to replace advice given to you by your health care provider. Make sure you discuss any questions you have with your health care provider. Document Released: 07/20/2008 Document Revised: 08/07/2015 Document Reviewed: 06/28/2014 Elsevier Interactive Patient Education  2017 [employer]

## 2016-06-21 IMAGING — CR DG HAND COMPLETE 3+V*R*
3 series · 3 of 3 positions shown · non-contrast
Comparison: 12/22/2014

CLINICAL DATA: Followup fifth metacarpal fracture

EXAM:
RIGHT HAND - COMPLETE 3+ VIEW

[hand pa]
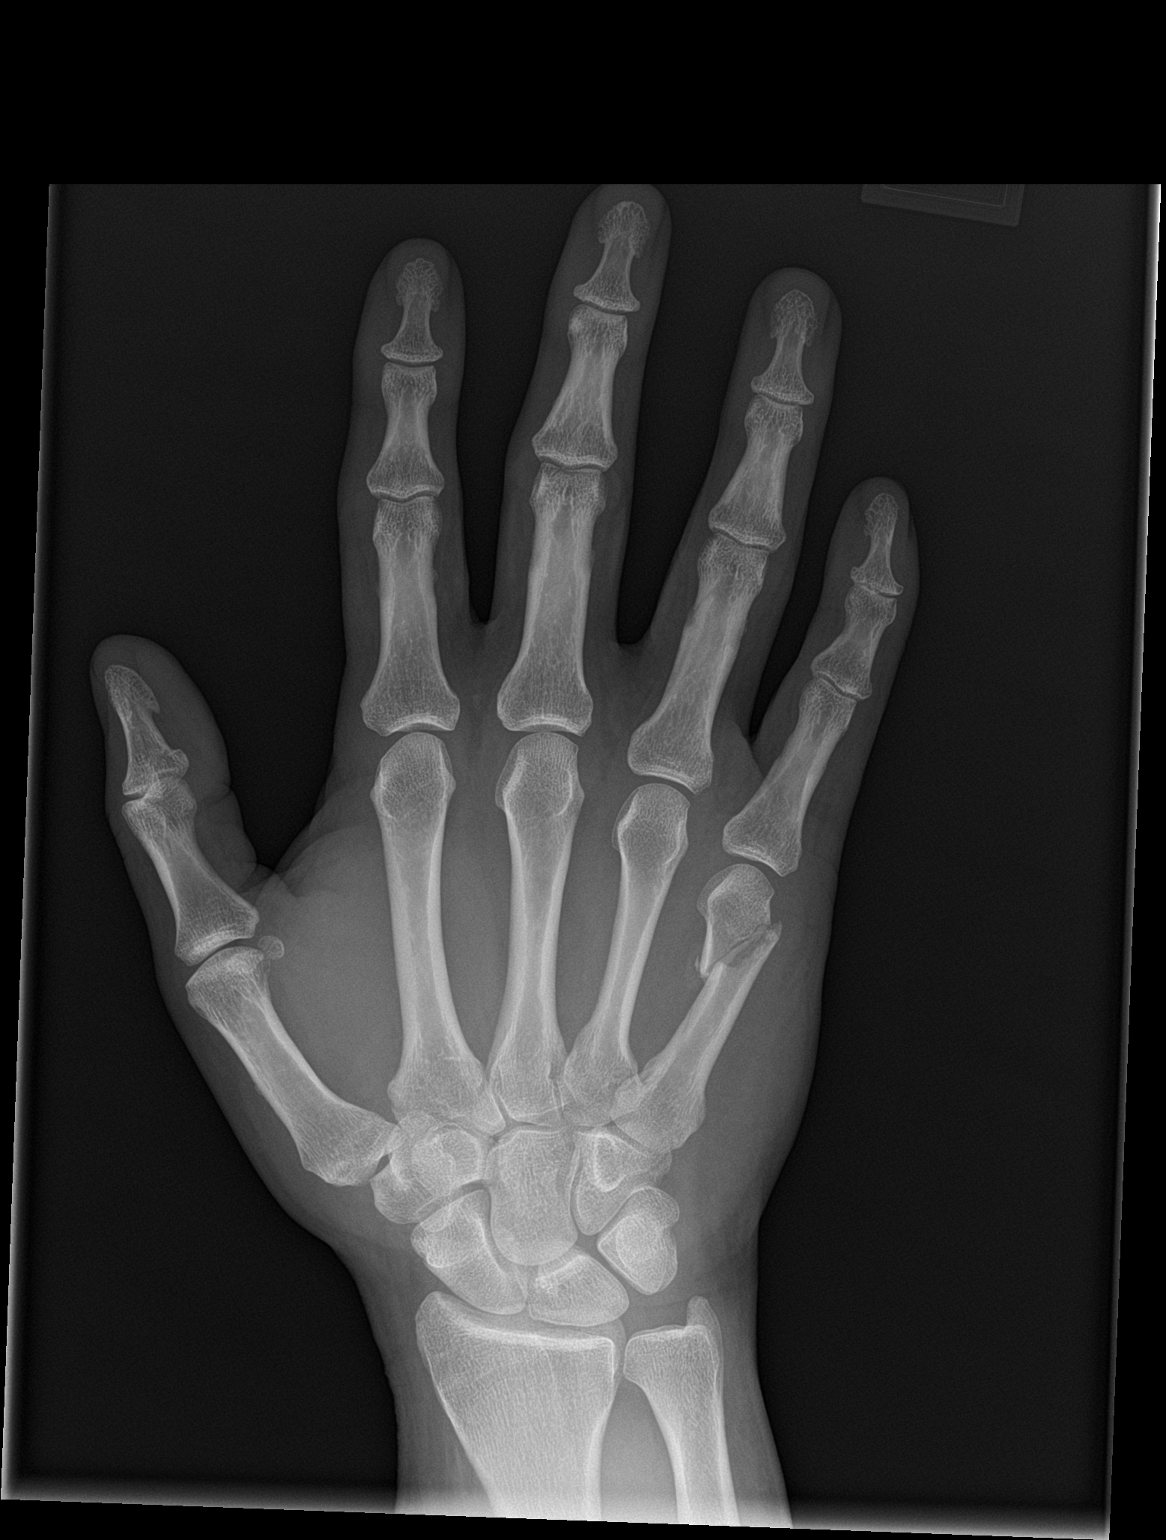

[hand obl]
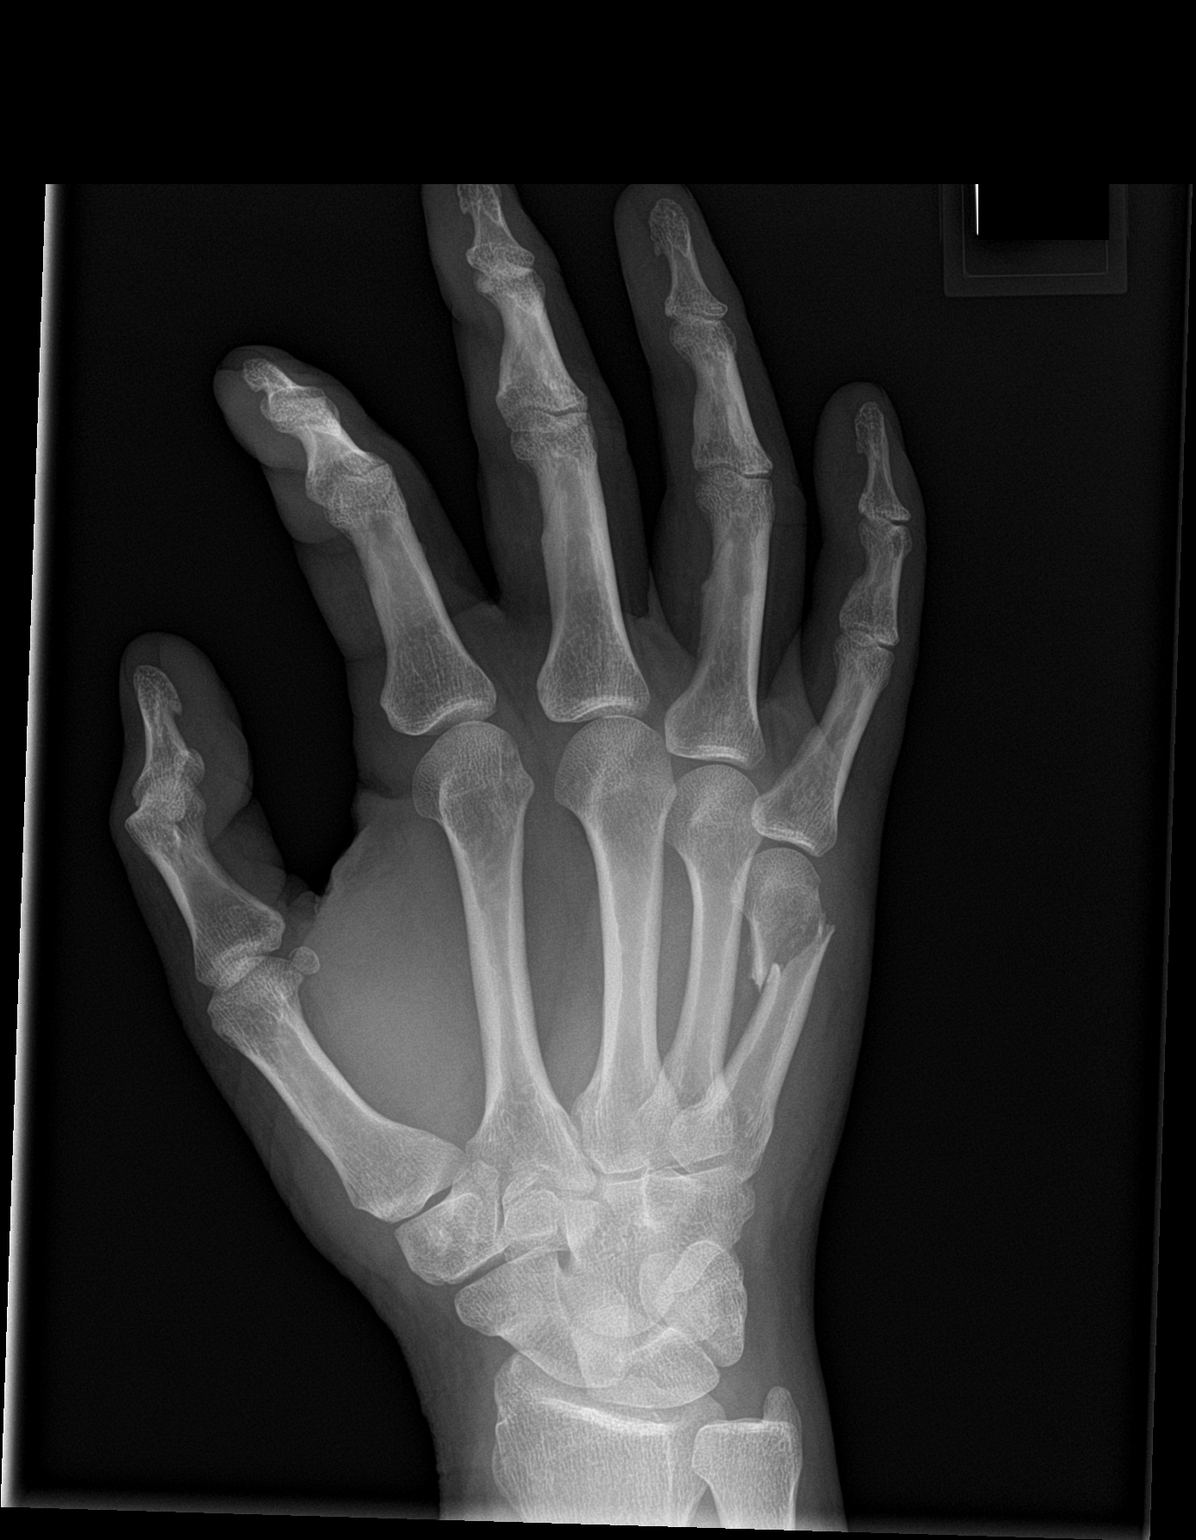

[hand lat]
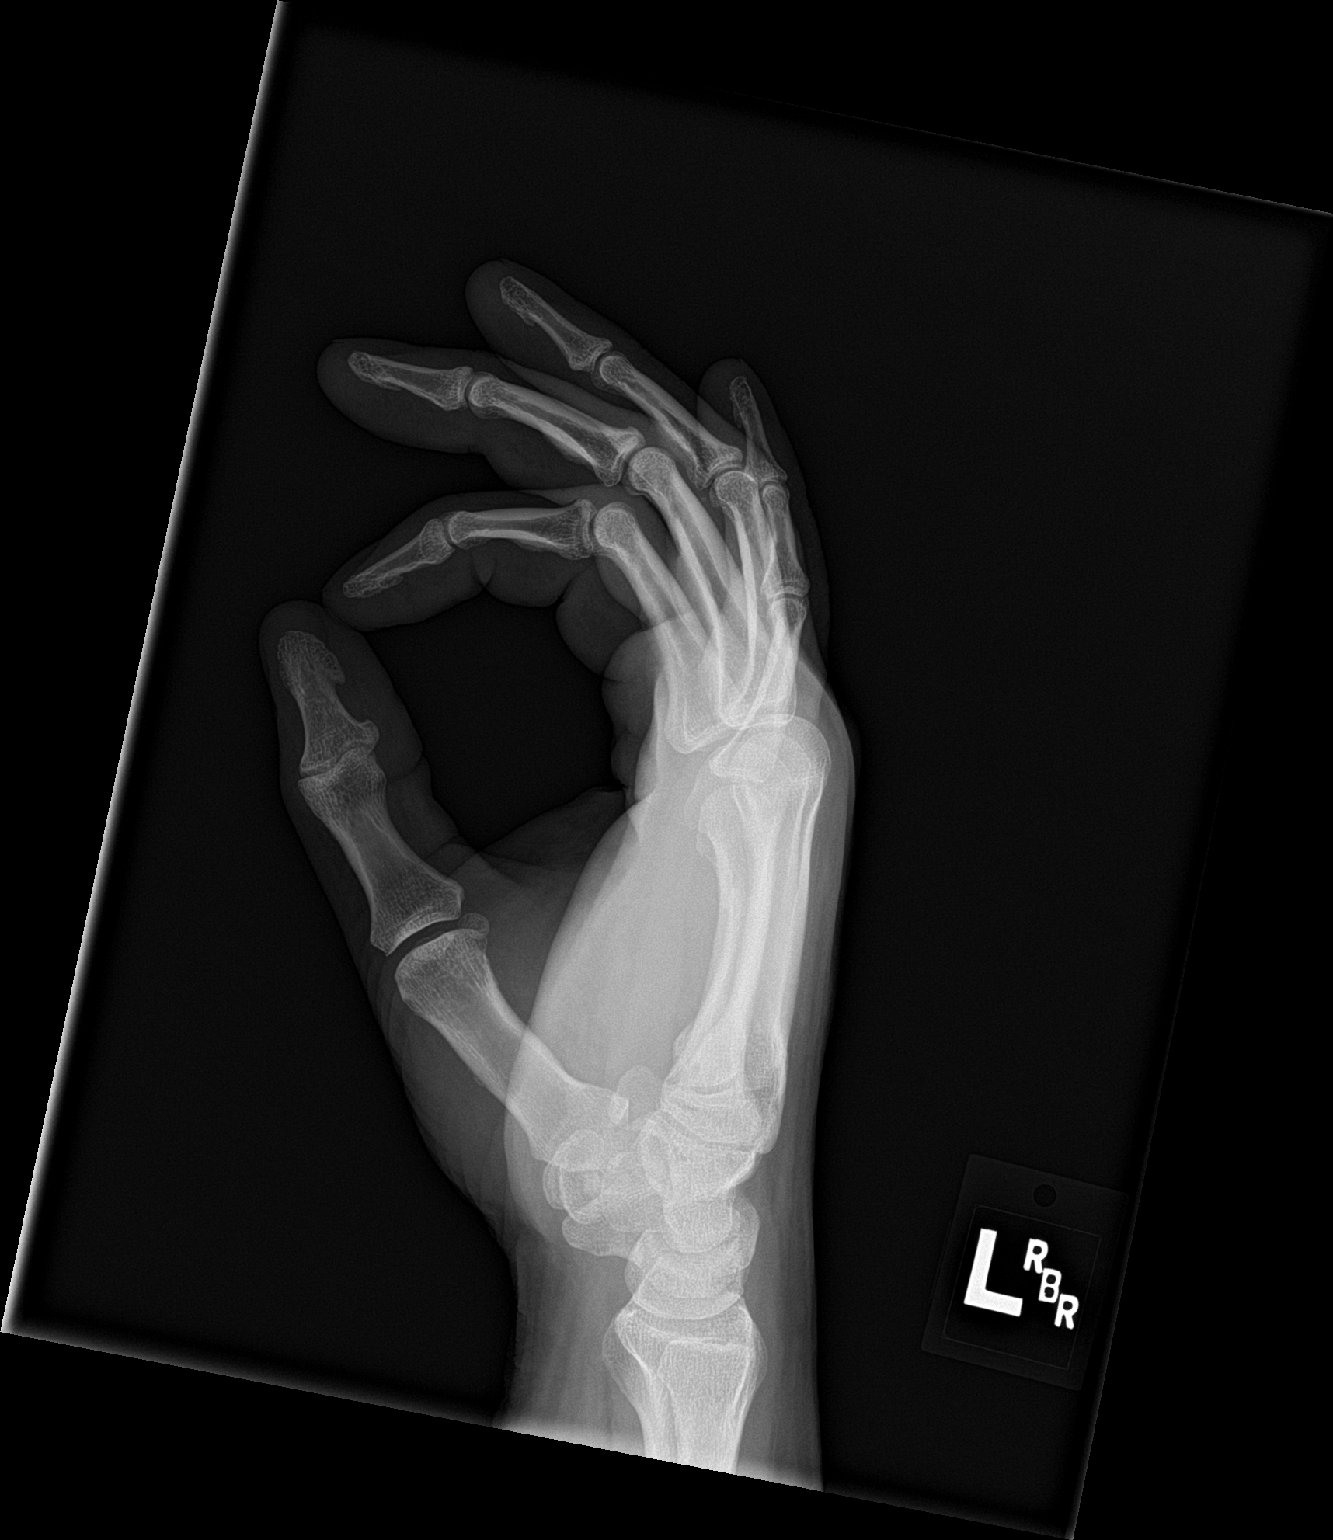

[3 of 3 positions shown; findings below may reference images not displayed]

FINDINGS: Fracture of the distal fifth metacarpal is similar to the prior
exam. There is some minimal callus formation along the radial margin
of the fracture line with no other evidence of healing. The degree
of angulation and minimal displacement are unchanged.

No new fractures.  Joints are normally spaced and aligned.
IMPRESSION: Minimal interval healing with subtle callus formation noted along
the radial margin of the fifth metacarpal fracture. No other change.

## 2016-11-10 ENCOUNTER — Telehealth: Payer: Self-pay | Admitting: Osteopathic Medicine

## 2016-11-10 DIAGNOSIS — Z0000 Encounter for general adult medical examination without abnormal findings: Secondary | ICD-10-CM

## 2016-11-10 DIAGNOSIS — E0590 Thyrotoxicosis, unspecified without thyrotoxic crisis or storm: Secondary | ICD-10-CM

## 2016-11-10 DIAGNOSIS — R7303 Prediabetes: Secondary | ICD-10-CM

## 2016-11-10 DIAGNOSIS — E781 Pure hyperglyceridemia: Secondary | ICD-10-CM

## 2016-11-10 NOTE — Telephone Encounter (Signed)
Patient called req to get lab order for cpe sent down this week pt has cpe for 11/21/16. Thanks

## 2016-11-11 NOTE — Telephone Encounter (Signed)
Labs entered, Pt advised.

## 2016-11-13 LAB — CBC WITH DIFFERENTIAL/PLATELET
Basophils Absolute: 0 cells/uL (ref 0–200)
Basophils Relative: 0 %
EOS PCT: 2 %
Eosinophils Absolute: 170 cells/uL (ref 15–500)
HCT: 44.6 % (ref 38.5–50.0)
Hemoglobin: 15.2 g/dL (ref 13.2–17.1)
LYMPHS PCT: 30 %
Lymphs Abs: 2550 cells/uL (ref 850–3900)
MCH: 29.7 pg (ref 27.0–33.0)
MCHC: 34.1 g/dL (ref 32.0–36.0)
MCV: 87.1 fL (ref 80.0–100.0)
MONO ABS: 680 {cells}/uL (ref 200–950)
MONOS PCT: 8 %
MPV: 11.8 fL (ref 7.5–12.5)
NEUTROS PCT: 60 %
Neutro Abs: 5100 cells/uL (ref 1500–7800)
PLATELETS: 189 10*3/uL (ref 140–400)
RBC: 5.12 MIL/uL (ref 4.20–5.80)
RDW: 13.9 % (ref 11.0–15.0)
WBC: 8.5 10*3/uL (ref 3.8–10.8)

## 2016-11-14 LAB — LIPID PANEL
CHOLESTEROL: 265 mg/dL — AB (ref <200)
HDL: 36 mg/dL — ABNORMAL LOW (ref >40)
LDL CALC: 181 mg/dL — AB (ref <100)
TRIGLYCERIDES: 241 mg/dL — AB (ref <150)
Total CHOL/HDL Ratio: 7.4 Ratio — ABNORMAL HIGH (ref <5.0)
VLDL: 48 mg/dL — ABNORMAL HIGH (ref <30)

## 2016-11-14 LAB — COMPLETE METABOLIC PANEL WITH GFR
ALT: 49 U/L — AB (ref 9–46)
AST: 24 U/L (ref 10–40)
Albumin: 4.7 g/dL (ref 3.6–5.1)
Alkaline Phosphatase: 59 U/L (ref 40–115)
BUN: 21 mg/dL (ref 7–25)
CALCIUM: 9.8 mg/dL (ref 8.6–10.3)
CHLORIDE: 104 mmol/L (ref 98–110)
CO2: 21 mmol/L (ref 20–32)
Creat: 1.13 mg/dL (ref 0.60–1.35)
GFR, Est African American: 89 mL/min (ref >=60)
GFR, Est Non African American: 77 mL/min (ref >=60)
GLUCOSE: 120 mg/dL — AB (ref 65–99)
POTASSIUM: 4.9 mmol/L (ref 3.5–5.3)
SODIUM: 140 mmol/L (ref 135–146)
Total Bilirubin: 0.5 mg/dL (ref 0.2–1.2)
Total Protein: 7.2 g/dL (ref 6.1–8.1)

## 2016-11-14 LAB — TSH: TSH: 1.91 mIU/L (ref 0.40–4.50)

## 2016-11-14 LAB — T4: T4, Total: 7.3 ug/dL (ref 4.9–10.5)

## 2016-11-14 LAB — HEMOGLOBIN A1C
HEMOGLOBIN A1C: 6 % — AB (ref <5.7)
MEAN PLASMA GLUCOSE: 126 mg/dL

## 2016-11-14 LAB — T3: T3 TOTAL: 127.5 ng/dL (ref 76–181)

## 2016-11-21 ENCOUNTER — Encounter: Payer: Self-pay | Admitting: Osteopathic Medicine

## 2016-11-21 ENCOUNTER — Ambulatory Visit (INDEPENDENT_AMBULATORY_CARE_PROVIDER_SITE_OTHER): Payer: Commercial Managed Care - PPO | Admitting: Osteopathic Medicine

## 2016-11-21 VITALS — BP 137/94 | HR 102 | Ht 70.0 in | Wt 234.0 lb

## 2016-11-21 DIAGNOSIS — E0500 Thyrotoxicosis with diffuse goiter without thyrotoxic crisis or storm: Secondary | ICD-10-CM

## 2016-11-21 DIAGNOSIS — R5383 Other fatigue: Secondary | ICD-10-CM | POA: Diagnosis not present

## 2016-11-21 DIAGNOSIS — R7303 Prediabetes: Secondary | ICD-10-CM

## 2016-11-21 DIAGNOSIS — E781 Pure hyperglyceridemia: Secondary | ICD-10-CM | POA: Diagnosis not present

## 2016-11-21 DIAGNOSIS — B351 Tinea unguium: Secondary | ICD-10-CM | POA: Insufficient documentation

## 2016-11-21 DIAGNOSIS — Z0000 Encounter for general adult medical examination without abnormal findings: Secondary | ICD-10-CM

## 2016-11-21 MED ORDER — CICLOPIROX 8 % EX SOLN: Freq: Every day | CUTANEOUS | 0 refills | Status: DC

## 2016-11-21 MED ORDER — TERBINAFINE HCL 250 MG PO TABS: 250.0000 mg | ORAL_TABLET | Freq: Every day | ORAL | 0 refills | Status: DC

## 2016-11-21 NOTE — Progress Notes (Signed)
HPI: Ricky Ortiz is a 46 y.o. male  who presents to [facility] today, 11/21/16,  for chief complaint of:  Chief Complaint  Patient presents with  . Annual Exam    Patient here for annual physical / wellness exam.  See preventive care reviewed as below.  Recent labs reviewed in detail with the patient.   Additional concerns today include:   Fatigue and lack of motivation. Previously tested for testosterone deficiency but his levels were normal. No concerns with depression.   Medical history reviewed since patient is new to me:  Hyperthyroid/Hypothyroid: Last seen by endocrinology 02/2016. Diagnosis of Graves' disease. Patient opted at that time not to treat, just follow-up with lab work. At this point, he is euthyroid based on most recent labs. Antibody levels are of course high last year.  High cholesterol, he has not been diligent about diet/exercise modifications. Reviewed most recent cholesterol labs with him, he would like to hold off on medications for now and feels ready to make changes with diet/exercise       Past medical, surgical, social and family history reviewed:  Patient Active Problem List   Diagnosis Date Noted  . Hyperthyroidism 01/31/2016  . Annual physical exam 01/14/2016  . Right hand tendonitis 09/04/2015  . Closed fracture of fifth metacarpal bone of right hand 12/22/2014  . Pre-diabetes 02/04/2012  . Hypertriglyceridemia 02/04/2012  . Lack of concentration 01/27/2012   Past Surgical History:  Procedure Laterality Date  . VASECTOMY     Social History  Substance Use Topics  . Smoking status: Never Smoker  . Smokeless tobacco: Not on file  . Alcohol use No   Family History  Problem Relation Age of Onset  . Alcohol abuse Unknown        grandparents  . Colon cancer Unknown        grandfather  . Hypertension Unknown        grandfather  . Hypothyroidism Mother      Current medication list and  allergy/intolerance information reviewed:   No current outpatient prescriptions on file.   No current facility-administered medications for this visit.    No Known Allergies    Review of Systems:  Constitutional:  No  fever, no chills, No recent illness, No unintentional weight changes. +significant fatigue.   HEENT: No  headache, no vision change, no hearing change, No sore throat, No  sinus pressure  Cardiac: No  chest pain, No  pressure, +occasional palpitations, No  Orthopnea  Respiratory:  No  shortness of breath. No  Cough  Gastrointestinal: No  abdominal pain, No  nausea, No  vomiting,  No  blood in stool, No  diarrhea, No  constipation   Musculoskeletal: No new myalgia/arthralgia  Genitourinary: No  incontinence, No  abnormal genital bleeding, No abnormal genital discharge  Skin: No  Rash, No other wounds/concerning lesions  Hem/Onc: No  easy bruising/bleeding, No  abnormal lymph node  Endocrine: No cold intolerance,  No heat intolerance. No polyuria/polydipsia/polyphagia   Neurologic: No  weakness, No  dizziness, No  slurred speech/focal weakness/facial droop  Psychiatric: No  concerns with depression, No  concerns with anxiety, No sleep problems, No mood problems  Exam:  BP (!) 137/94   Pulse (!) 102   Ht '5\' 10"'$  (1.778 m)   Wt 234 lb (106.1 kg)   BMI 33.58 kg/m   Constitutional: VS see above. General Appearance: alert, well-developed, well-nourished, NAD  Eyes: Normal lids and conjunctive, non-icteric sclera  Ears, Nose, Mouth, Throat: MMM, Normal external inspection ears/nares/mouth/lips/gums. TM normal bilaterally. Pharynx/tonsils no erythema, no exudate. Nasal mucosa normal.   Neck: No masses, trachea midline. No thyroid enlargement. No tenderness/mass appreciated. No lymphadenopathy  Respiratory: Normal respiratory effort. no wheeze, no rhonchi, no rales  Cardiovascular: S1/S2 normal, no murmur, no rub/gallop auscultated. RRR. No lower extremity  edema.  Gastrointestinal: Nontender, no masses. No hepatomegaly, no splenomegaly. No hernia appreciated. Bowel sounds normal. Rectal exam deferred.   Musculoskeletal: Gait normal. No clubbing/cyanosis of digits.   Neurological: Normal balance/coordination. No tremor.   Skin: warm, dry, intact. No rash/ulcer.   Psychiatric: Normal judgment/insight. Normal mood and affect. Oriented x3.   Recent Results (from the past 2160 hour(s))  CBC with Differential     Status: None   Collection Time: 11/11/16  8:37 AM  Result Value Ref Range   WBC 8.5 3.8 - 10.8 K/uL   RBC 5.12 4.20 - 5.80 MIL/uL   Hemoglobin 15.2 13.2 - 17.1 g/dL   HCT 44.6 38.5 - 50.0 %   MCV 87.1 80.0 - 100.0 fL   MCH 29.7 27.0 - 33.0 pg   MCHC 34.1 32.0 - 36.0 g/dL   RDW 13.9 11.0 - 15.0 %   Platelets 189 140 - 400 K/uL   MPV 11.8 7.5 - 12.5 fL   Neutro Abs 5,100 1,500 - 7,800 cells/uL   Lymphs Abs 2,550 850 - 3,900 cells/uL   Monocytes Absolute 680 200 - 950 cells/uL   Eosinophils Absolute 170 15 - 500 cells/uL   Basophils Absolute 0 0 - 200 cells/uL   Neutrophils Relative % 60 %   Lymphocytes Relative 30 %   Monocytes Relative 8 %   Eosinophils Relative 2 %   Basophils Relative 0 %   Smear Review Criteria for review not met   COMPLETE METABOLIC PANEL WITH GFR     Status: Abnormal   Collection Time: 11/11/16  8:37 AM  Result Value Ref Range   Sodium 140 135 - 146 mmol/L   Potassium 4.9 3.5 - 5.3 mmol/L   Chloride 104 98 - 110 mmol/L   CO2 21 20 - 32 mmol/L    Comment: ** Please note change in reference range(s). **      Glucose, Bld 120 (H) 65 - 99 mg/dL   BUN 21 7 - 25 mg/dL   Creat 1.13 0.60 - 1.35 mg/dL   Total Bilirubin 0.5 0.2 - 1.2 mg/dL   Alkaline Phosphatase 59 40 - 115 U/L   AST 24 10 - 40 U/L   ALT 49 (H) 9 - 46 U/L   Total Protein 7.2 6.1 - 8.1 g/dL   Albumin 4.7 3.6 - 5.1 g/dL   Calcium 9.8 8.6 - 10.3 mg/dL   GFR, Est African American 89 >=60 mL/min   GFR, Est Non African American 77 >=60  mL/min  TSH     Status: None   Collection Time: 11/11/16  8:37 AM  Result Value Ref Range   TSH 1.91 0.40 - 4.50 mIU/L  HgB A1c     Status: Abnormal   Collection Time: 11/11/16  8:37 AM  Result Value Ref Range   Hgb A1c MFr Bld 6.0 (H) <5.7 %    Comment:   For someone without known diabetes, a hemoglobin A1c value between 5.7% and 6.4% is consistent with prediabetes and should be confirmed with a follow-up test.   For someone with known diabetes, a value <7% indicates that their diabetes is well controlled. A1c  targets should be individualized based on duration of diabetes, age, co-morbid conditions and other considerations.   This assay result is consistent with an increased risk of diabetes.   Currently, no consensus exists regarding use of hemoglobin A1c for diagnosis of diabetes in children.      Mean Plasma Glucose 126 mg/dL  T3     Status: None   Collection Time: 11/11/16  8:37 AM  Result Value Ref Range   T3, Total 127.5 76 - 181 ng/dL  T4     Status: None   Collection Time: 11/11/16  8:37 AM  Result Value Ref Range   T4, Total 7.3 4.9 - 10.5 ug/dL    Comment: ** Please note change in reference range(s). **     Lipid Profile     Status: Abnormal   Collection Time: 11/11/16  8:37 AM  Result Value Ref Range   Cholesterol 265 (H) <200 mg/dL   Triglycerides 241 (H) <150 mg/dL   HDL 36 (L) >40 mg/dL   Total CHOL/HDL Ratio 7.4 (H) <5.0 Ratio   VLDL 48 (H) <30 mg/dL   LDL Cholesterol 181 (H) <100 mg/dL     ASSESSMENT/PLAN:    Annual physical exam  Pre-diabetes - Discussed lifestyle modifications  Hypertriglyceridemia - Consider statin therapy, I still modifications. Patient would like to avoid medications - Plan: Lipid panel  Fatigue, unspecified type - Plan: COMPLETE METABOLIC PANEL WITH GFR, Lipid panel, Testosterone  Onychomycosis - Plan: ciclopirox (PENLAC) 8 % solution, terbinafine (LAMISIL) 250 MG tablet  Graves disease - Euthyroid as of 10/2016 but  previously hyperthyroid with positive antibodies. Patient would like to avoid medication until he absolutely needs it - Plan: TSH, T4, free    MALE PREVENTIVE CARE  updated 11/21/16  ANNUAL SCREENING/COUNSELING  Any changes to health in the past year? no  Diet/Exercise - HEALTHY HABITS DISCUSSED TO DECREASE CV RISK History  Smoking Status  . Never Smoker  Smokeless Tobacco  . Not on file   History  Alcohol Use No  Average 1x /2-3 weeks  Depression screen PHQ 2/9 11/21/2016  Decreased Interest 0  Down, Depressed, Hopeless 0  PHQ - 2 Score 0    SEXUAL/REPRODUCTIVE HEALTH  Sexually active in the past year? - Yes with male.  STI testing needed/desired today? - no  Any concerns with testosterone/libido? - yes  INFECTIOUS DISEASE SCREENING  HIV - does not need  GC/CT - does not need  HepC - does not need  TB - does not need  CANCER SCREENING  Lung - does not need  Colon - does not need   Prostate - does not need  OTHER DISEASE SCREENING  Lipid - does not need  DM2 - does not need  AAA - does not need  Osteoporosis - does not need  ADULT VACCINATION  Influenza - annual vaccine recommended  Td - booster every 10 years   Zoster - option at 50, yes at 60+   PCV13 - was not indicated  PPSV23 - was not indicated Immunization History  Administered Date(s) Administered  . Td 06/08/2008        Patient Instructions  Plan: Toenail lacquer (6+ months) + pills (12 weeks) for fungal infection  Repeat labs in 3 months - testosterone, cholesterol, thyroid    Visit summary with medication list and pertinent instructions was printed for patient to review. All questions at time of visit were answered - patient instructed to contact office with any additional concerns. ER/RTC precautions were  reviewed with the patient. Follow-up plan: Return in about 3 months (around 02/20/2017) for review labs and recheck fatigue .  Note: Total time spent on  problem-based portion of exam: 25 minutes, greater than 50% of the problem-based portion of this visit was spent face-to-face counseling and coordinating care for the following: Pre-diabetes, Hypertriglyceridemia, Fatigue, unspecified type, Onychomycosis, and Graves disease

## 2016-11-21 NOTE — Patient Instructions (Signed)
Plan: Toenail lacquer (6+ months) + pills (12 weeks) for fungal infection  Repeat labs in 3 months - testosterone, cholesterol, thyroid

## 2016-12-16 ENCOUNTER — Telehealth: Payer: Self-pay | Admitting: *Deleted

## 2016-12-16 DIAGNOSIS — B351 Tinea unguium: Secondary | ICD-10-CM

## 2016-12-16 NOTE — Telephone Encounter (Signed)
Pre Authorization sent to cover my meds. Ciclopirox 8% EX [id number]

## 2016-12-22 NOTE — Telephone Encounter (Signed)
cicloporox has been denied because the patient has to try and fail at least one oral medication

## 2016-12-23 NOTE — Telephone Encounter (Signed)
Future lab orders placed. Medication had already been sent into the pharmacy. Left message on vm with patient instructions and to f/u in 2-4 weeks for labs

## 2016-12-23 NOTE — Telephone Encounter (Signed)
Can send terbinafine 250 mg po daily #90 refill 0 to treat for three months for toenail fungus Will need to repeat labs: CMP in 2-4 weeks to monitor liver function since one of his liver enzymes was slightly high. Can these future orders be placed for dx onychomycosis? Thanks!

## 2017-01-09 ENCOUNTER — Telehealth: Payer: Self-pay

## 2017-01-09 NOTE — Telephone Encounter (Signed)
Pt called and stated that the pharmacy denied Lamisil. [facility] stated that [facility] is requiring a Pre-Auth. [facility] is suppose to fax Pre-Auth form.

## 2017-02-20 ENCOUNTER — Ambulatory Visit: Payer: Commercial Managed Care - PPO | Admitting: Osteopathic Medicine

## 2017-03-19 ENCOUNTER — Ambulatory Visit: Payer: Commercial Managed Care - PPO | Admitting: Osteopathic Medicine

## 2017-03-19 DIAGNOSIS — Z0189 Encounter for other specified special examinations: Secondary | ICD-10-CM

## 2017-09-20 ENCOUNTER — Other Ambulatory Visit: Payer: Self-pay

## 2017-09-20 ENCOUNTER — Emergency Department (INDEPENDENT_AMBULATORY_CARE_PROVIDER_SITE_OTHER): Payer: Commercial Managed Care - PPO

## 2017-09-20 ENCOUNTER — Emergency Department (INDEPENDENT_AMBULATORY_CARE_PROVIDER_SITE_OTHER)
Admission: EM | Admit: 2017-09-20 | Discharge: 2017-09-20 | Disposition: A | Discharge status: Home / Self Care | Payer: Commercial Managed Care - PPO | Source: Home / Self Care | Attending: Family Medicine | Admitting: Family Medicine

## 2017-09-20 DIAGNOSIS — M5136 Other intervertebral disc degeneration, lumbar region: Secondary | ICD-10-CM | POA: Diagnosis not present

## 2017-09-20 DIAGNOSIS — M545 Low back pain, unspecified: Secondary | ICD-10-CM

## 2017-09-20 MED ORDER — CYCLOBENZAPRINE HCL 10 MG PO TABS: 10.0000 mg | ORAL_TABLET | Freq: Every day | ORAL | 1 refills | Status: DC

## 2017-09-20 MED ORDER — PREDNISONE 20 MG PO TABS: ORAL_TABLET | ORAL | 0 refills | Status: DC

## 2017-09-20 NOTE — ED Provider Notes (Signed)
[facility]    CSN: [id number] Arrival date & time: 09/20/17  1213     History   Chief Complaint Chief Complaint  Patient presents with  . Back Pain    HPI Ricky Ortiz is a 47 y.o. male.   Patient reports that he has been driving on long trips recently.  During the past five days he has had persistent mid-lower back pain.  He recalls no injury.  No GU symptoms.  No rash.  The history is provided by the patient.  Back Pain  Location:  Lumbar spine Quality:  Aching Radiates to:  Does not radiate Pain severity:  Moderate Pain is:  Worse during the day Onset quality:  Gradual Duration:  5 days Timing:  Constant Progression:  Unchanged Chronicity:  New Context: not recent injury   Relieved by:  Lying down Worsened by:  Sitting Ineffective treatments:  NSAIDs Associated symptoms: no abdominal pain, no abdominal swelling, no bladder incontinence, no bowel incontinence, no chest pain, no dysuria, no fever, no leg pain, no numbness, no paresthesias, no perianal numbness, no tingling, no weakness and no weight loss     Past Medical History:  Diagnosis Date  . Hyperlipidemia LDL goal < 160 02/04/2012  . Hypertriglyceridemia 02/04/2012  . Lack of concentration 01/27/2012  . Pre-diabetes 02/04/2012    Patient Active Problem List   Diagnosis Date Noted  . Onychomycosis 11/21/2016  . Fatigue 11/21/2016  . Graves disease 01/31/2016  . Annual physical exam 01/14/2016  . Right hand tendonitis 09/04/2015  . Closed fracture of fifth metacarpal bone of right hand 12/22/2014  . Pre-diabetes 02/04/2012  . Hypertriglyceridemia 02/04/2012  . Lack of concentration 01/27/2012    Past Surgical History:  Procedure Laterality Date  . VASECTOMY         Home Medications    Prior to Admission medications   Medication Sig Start Date End Date Taking? Authorizing Provider  ciclopirox (PENLAC) 8 % solution Apply topically at bedtime. Apply over nail and surrounding  skin. Apply daily over previous coat. After seven (7) days, may remove with alcohol and continue cycle. 11/21/16   Sunnie Nielsen, DO  cyclobenzaprine (FLEXERIL) 10 MG tablet Take 1 tablet (10 mg total) by mouth at bedtime. 09/20/17   Lattie Haw, MD  predniSONE (DELTASONE) 20 MG tablet Take one tab by mouth twice daily for 4 days, then one daily. Take with food. 09/20/17   Lattie Haw, MD    Family History Family History  Problem Relation Age of Onset  . Alcohol abuse Unknown        grandparents  . Colon cancer Unknown        grandfather  . Hypertension Unknown        grandfather  . Hypothyroidism Mother     Social History Social History   Tobacco Use  . Smoking status: Never Smoker  . Smokeless tobacco: Never Used  Substance Use Topics  . Alcohol use: No  . Drug use: No     Allergies   Vicodin [hydrocodone-acetaminophen]   Review of Systems Review of Systems  Constitutional: Negative for fever and weight loss.  Cardiovascular: Negative for chest pain.  Gastrointestinal: Negative for abdominal pain and bowel incontinence.  Genitourinary: Negative for bladder incontinence and dysuria.  Musculoskeletal: Positive for back pain.  Neurological: Negative for tingling, weakness, numbness and paresthesias.  All other systems reviewed and are negative.    Physical Exam Triage Vital Signs ED Triage Vitals [09/20/17 1242]  Enc  Vitals Group     BP 122/86     Pulse Rate 97     Resp      Temp 98.3 F (36.8 C)     Temp Source Oral     SpO2 96 %     Weight 230 lb (104.3 kg)     Height 5\' 10"  (1.778 m)     Head Circumference      Peak Flow      Pain Score 3     Pain Loc      Pain Edu?      Excl. in GC?    No data found.  Updated Vital Signs BP 122/86 (BP Location: Right Arm)   Pulse 97   Temp 98.3 F (36.8 C) (Oral)   Ht 5\' 10"  (1.778 m)   Wt 230 lb (104.3 kg)   SpO2 96%   BMI 33.00 kg/m   Visual Acuity Right Eye Distance:   Left Eye Distance:     Bilateral Distance:    Right Eye Near:   Left Eye Near:    Bilateral Near:     Physical Exam  Constitutional: He appears well-developed and well-nourished. No distress.  HENT:  Head: Normocephalic.  Right Ear: External ear normal.  Left Ear: External ear normal.  Nose: Nose normal.  Mouth/Throat: Oropharynx is clear and moist.  Eyes: Pupils are equal, round, and reactive to light. Conjunctivae are normal.  Neck: Normal range of motion.  Cardiovascular: Normal heart sounds.  Pulmonary/Chest: Breath sounds normal.  Abdominal: He exhibits no mass. There is no tenderness.  Musculoskeletal: He exhibits no edema.       Lumbar back: He exhibits decreased range of motion and tenderness. He exhibits no bony tenderness, no swelling and no edema.       Back:  Back:   Can heel/toe walk and squat without difficulty.  Decreased forward flexion.  Tenderness in the midline and bilateral paraspinous muscles from L3 to L4.   Straight leg raising test is negative.  Sitting knee extension test is negative.  Strength and sensation in the lower extremities is normal.  Patellar and achilles reflexes are normal   Neurological: He is alert.  Skin: Skin is warm and dry.  Nursing note and vitals reviewed.    UC Treatments / Results  Labs (all labs ordered are listed, but only abnormal results are displayed) Labs Reviewed - No data to display  EKG None  Radiology Dg Lumbar Spine Complete  Result Date: 09/20/2017 CLINICAL DATA:  Centralized low back pain since this past Tuesday after long drive. No known injury. EXAM: LUMBAR SPINE - COMPLETE 4+ VIEW COMPARISON:  None. FINDINGS: There are 6 non rib-bearing lumbar type vertebral bodies. For the purposes of this dictation, lumbar levels be labeled L1 through L6. There is straightening and slight reversal of the expected lumbar lordosis. No anterolisthesis or retrolisthesis. No definite pars defects. Lumbar vertebral body heights appear preserved. Mild to  moderate multilevel lumbar spine DDD, worse at L3-L4 and to a lesser extent, L3-4-L5, with disc space height loss, endplate irregularity and sclerosis. Limited visualization the bilateral SI joints is normal Regional bowel gas pattern and soft tissues are normal. IMPRESSION: 1. Straightening and slight reversal the expected lumbar lordosis, nonspecific though could be seen in the setting of muscle spasm. Otherwise, no acute findings. 2. Mild-to-moderate multilevel lumbar spine DDD, worse at L3-L4. Electronically Signed   By: Simonne ComeJohn  Watts M.D.   On: 09/20/2017 13:13    Procedures Procedures (  including critical care time)  Medications Ordered in UC Medications - No data to display  Initial Impression / Assessment and Plan / UC Course  I have reviewed the triage vital signs and the nursing notes.  Pertinent labs & imaging results that were available during my care of the patient were reviewed by me and considered in my medical decision making (see chart for details).    Begin prednisone burst/taper, and Flexeril at bedtime. Followup with Dr. Rodney Langton if not improved 2 weeks.   Final Clinical Impressions(s) / UC Diagnoses   Final diagnoses:  Acute midline low back pain without sciatica     Discharge Instructions     Apply ice pack for 20 to 30 minutes, 3 to 4 times daily  Continue until pain and swelling decrease.  May take Tylenol as needed for pain.  Recommend obtaining lumbar support for car seat.    ED Prescriptions    Medication Sig Dispense Auth. Provider   predniSONE (DELTASONE) 20 MG tablet Take one tab by mouth twice daily for 4 days, then one daily. Take with food. 12 tablet Lattie Haw, MD   cyclobenzaprine (FLEXERIL) 10 MG tablet Take 1 tablet (10 mg total) by mouth at bedtime. 10 tablet Lattie Haw, MD         Lattie Haw, MD 09/28/17 [phone]

## 2017-09-20 NOTE — Discharge Instructions (Addendum)
Apply ice pack for 20 to 30 minutes, 3 to 4 times daily  Continue until pain and swelling decrease.  May take Tylenol as needed for pain.  Recommend obtaining lumbar support for car seat.

## 2017-09-20 NOTE — ED Triage Notes (Signed)
Pt c/o radiating lower back pain since late Tuesday. Denies injury but has been doing a lot of driving. Has tried heating pads and ibuprofen as needed with no relief.

## 2017-11-30 ENCOUNTER — Telehealth: Payer: Self-pay

## 2017-11-30 DIAGNOSIS — E0500 Thyrotoxicosis with diffuse goiter without thyrotoxic crisis or storm: Secondary | ICD-10-CM

## 2017-11-30 DIAGNOSIS — R7303 Prediabetes: Secondary | ICD-10-CM

## 2017-11-30 DIAGNOSIS — Z0000 Encounter for general adult medical examination without abnormal findings: Secondary | ICD-10-CM

## 2017-11-30 DIAGNOSIS — E781 Pure hyperglyceridemia: Secondary | ICD-10-CM

## 2017-11-30 NOTE — Telephone Encounter (Signed)
Pt has an upcoming appt for annual check on 12/03/17. Requesting to complete annual labs before his appt. Thanks.

## 2017-12-01 NOTE — Telephone Encounter (Signed)
Left a detailed vm msg for pt regarding provider's note & fasting lab order. Call back information provided.

## 2017-12-01 NOTE — Telephone Encounter (Signed)
Orders are in place, he can come to the lab tomorrow morning fasting and I should still have results by Thursday at his appointment

## 2017-12-03 ENCOUNTER — Ambulatory Visit (INDEPENDENT_AMBULATORY_CARE_PROVIDER_SITE_OTHER): Payer: Commercial Managed Care - PPO | Admitting: Osteopathic Medicine

## 2017-12-03 ENCOUNTER — Encounter: Payer: Self-pay | Admitting: Osteopathic Medicine

## 2017-12-03 ENCOUNTER — Ambulatory Visit (INDEPENDENT_AMBULATORY_CARE_PROVIDER_SITE_OTHER): Payer: Commercial Managed Care - PPO

## 2017-12-03 VITALS — BP 138/80 | HR 93 | Temp 98.3°F | Wt 234.7 lb

## 2017-12-03 DIAGNOSIS — R0602 Shortness of breath: Secondary | ICD-10-CM

## 2017-12-03 DIAGNOSIS — E781 Pure hyperglyceridemia: Secondary | ICD-10-CM | POA: Diagnosis not present

## 2017-12-03 DIAGNOSIS — Z0000 Encounter for general adult medical examination without abnormal findings: Secondary | ICD-10-CM | POA: Diagnosis not present

## 2017-12-03 DIAGNOSIS — R7989 Other specified abnormal findings of blood chemistry: Secondary | ICD-10-CM

## 2017-12-03 DIAGNOSIS — E0500 Thyrotoxicosis with diffuse goiter without thyrotoxic crisis or storm: Secondary | ICD-10-CM | POA: Diagnosis not present

## 2017-12-03 DIAGNOSIS — R7303 Prediabetes: Secondary | ICD-10-CM

## 2017-12-03 NOTE — Patient Instructions (Addendum)
General Preventive Care  Most recent routine screening lipids/other labs: ordered today  Tobacco: don't! Alcohol: responsible moderation is ok for most people. Recreational/Illicit Drugs: don't!  Exercise: as tolerated to reduce risk of cardiovascular disease and diabetes  Mental health: if need for mental health care (medicines, counseling, other), or concerns about moods, please let me know!   Sexual health: if need for STD testing, or if libido/pain concerns, please let me know!  . Cholesterol & Diabetes: recommended screening annually . Thyroid and Vitamin D: routine screening not medically necessary for most people, most insurance will not cover this test as part of annual physical "free labs"  Vaccines  Flu vaccine: recommended every fall (by Halloween!)  Shingles vaccine: Shingrix recommended after age 48  Pneumonia vaccines: Prevnar and Pneumovax recommended after age 48, sooner if diabetes, COPD/asthma, others  Tetanus booster: Tdap recommended every 10 years - due 05/2018 Cancer screenings   Colon cancer screening: recommended at age 48, colonoscopy sooner if risk factors   Prostate cancer screening: recommendations vary, optional PSA blood test for men around age 48 Infection screenings . HIV: recommended screening at least once age 48-65 . Gonorrhea/Chlamydia: screening as needed . Hepatitis C: recommended for anyone born [id number] . TB: at-risk populations or as required by employer  Other . Bone Density Test: recommended for men at age 48, sooner depending on risk factors . Advanced Directive: Living Will and/or Healthcare Power of Attorney recommended for all adults, regardless of age or health                   For shortness of breath issues, EKG today looks fine, will get a chest x-ray, and as long as these and blood work are all normal, may consider having you back in the office for a lung function test to rule out COPD or other lung problem,  what other steps may need to be taken.   Please note: Preventive care issues were addressed today as part of your annual wellness physical, and this care should be covered under your insurance. However there were other medical issues which were also addressed today and for which tests/labs were ordered, and insurance may bill you separately for a "problem-based visit" for this care for breathing issues. Any questions or concerns about charges which may appear on your billing statement should be directed to your insurance company or to [facility] billing department, and they will contact our office if there are further concerns.

## 2017-12-03 NOTE — Progress Notes (Signed)
HPI: Ricky Ortiz is a 47 y.o. male who  has a past medical history of Hyperlipidemia LDL goal < 160 (02/04/2012), Hypertriglyceridemia (02/04/2012), Lack of concentration (01/27/2012), and Pre-diabetes (02/04/2012).  he presents to [facility] today, 12/03/17,  for chief complaint of: Annual Physical   Patient here for annual physical / wellness exam.  See preventive care reviewed as below.  Recent labs reviewed in detail with the patient.   Additional concerns today include:   Back pain helped by flexeril  SOB on exertion, noticing more when he is climbing the stairs at home, this is a new problem for him over the past couple of months.  He is not experiencing any chest pain on exertion, no dizziness, no shortness of breath at rest, no syncope or presyncope, no orthopnea or lower extremity swelling.  He is not on any kind of formal exercise regimen.  Story of respiratory disease, cardiac disease.      Past medical, surgical, social and family history reviewed:  Patient Active Problem List   Diagnosis Date Noted  . Onychomycosis 11/21/2016  . Fatigue 11/21/2016  . Graves disease 01/31/2016  . Annual physical exam 01/14/2016  . Right hand tendonitis 09/04/2015  . Closed fracture of fifth metacarpal bone of right hand 12/22/2014  . Pre-diabetes 02/04/2012  . Hypertriglyceridemia 02/04/2012  . Lack of concentration 01/27/2012    Past Surgical History:  Procedure Laterality Date  . VASECTOMY      Social History   Tobacco Use  . Smoking status: Never Smoker  . Smokeless tobacco: Never Used  Substance Use Topics  . Alcohol use: No    Family History  Problem Relation Age of Onset  . Alcohol abuse Unknown        grandparents  . Colon cancer Unknown        grandfather  . Hypertension Unknown        grandfather  . Hypothyroidism Mother      Current medication list and allergy/intolerance information reviewed:    No current  outpatient medications on file.   No current facility-administered medications for this visit.     Allergies  Allergen Reactions  . Vicodin [Hydrocodone-Acetaminophen]     dizziness      Review of Systems:  Constitutional:  No  fever, no chills, No recent illness, No unintentional weight changes. No significant fatigue.   HEENT: No  headache, no vision change, no hearing change, No sore throat, No  sinus pressure  Cardiac: No  chest pain, No  pressure, No palpitations, No  Orthopnea  Respiratory:  +shortness of breath. No  Cough  Gastrointestinal: No  abdominal pain, No  nausea, No  vomiting,  No  blood in stool, No  diarrhea, No  constipation   Musculoskeletal: No new myalgia/arthralgia  Skin: No  Rash, No other wounds/concerning lesions  Genitourinary: No  incontinence, No  abnormal genital bleeding, No abnormal genital discharge  Hem/Onc: No  easy bruising/bleeding, No  abnormal lymph node  Endocrine: No cold intolerance,  No heat intolerance. No polyuria/polydipsia/polyphagia   Neurologic: No  weakness, No  dizziness, No  slurred speech/focal weakness/facial droop  Psychiatric: No  concerns with depression, No  concerns with anxiety, No sleep problems, No mood problems  Exam:  BP 138/80 (BP Location: Left Arm, Patient Position: Sitting, Cuff Size: Normal)   Pulse 93   Temp 98.3 F (36.8 C) (Oral)   Wt 234 lb 11.2 oz (106.5 kg)   BMI 33.68 kg/m  Constitutional: VS see above. General Appearance: alert, well-developed, well-nourished, NAD  Eyes: Normal lids and conjunctive, non-icteric sclera  Ears, Nose, Mouth, Throat: MMM, Normal external inspection ears/nares/mouth/lips/gums. TM normal bilaterally. Pharynx/tonsils no erythema, no exudate. Nasal mucosa normal.   Neck: No masses, trachea midline. No thyroid enlargement. No tenderness/mass appreciated. No lymphadenopathy  Respiratory: Normal respiratory effort. no wheeze, no rhonchi, no  rales  Cardiovascular: S1/S2 normal, no murmur, no rub/gallop auscultated. RRR. No lower extremity edema.   Gastrointestinal: Nontender, no masses. No hepatomegaly, no splenomegaly. No hernia appreciated. Bowel sounds normal. Rectal exam deferred.   Musculoskeletal: Gait normal. No clubbing/cyanosis of digits.   Neurological: Normal balance/coordination. No tremor. No cranial nerve deficit on limited exam. Motor and sensation intact and symmetric. Cerebellar reflexes intact.   Skin: warm, dry, intact. No rash/ulcer. No concerning nevi or subq nodules on limited exam.    Psychiatric: Normal judgment/insight. Normal mood and affect. Oriented x3.    EKG interpretation: Rate: 84  Rhythm: sinus No ST/T changes concerning for acute ischemia/infarct  Previous EKG none available for comparison    ASSESSMENT/PLAN:   Annual physical exam  Graves disease  Pre-diabetes  Hypertriglyceridemia  Low TSH level  SOB (shortness of breath) on exertion - Plan: EKG 12-Lead     Patient Instructions  General Preventive Care  Most recent routine screening lipids/other labs: ordered today  Tobacco: don't! Alcohol: responsible moderation is ok for most people. Recreational/Illicit Drugs: don't!  Exercise: as tolerated to reduce risk of cardiovascular disease and diabetes  Mental health: if need for mental health care (medicines, counseling, other), or concerns about moods, please let me know!   Sexual health: if need for STD testing, or if libido/pain concerns, please let me know!  . Cholesterol & Diabetes: recommended screening annually . Thyroid and Vitamin D: routine screening not medically necessary for most people, most insurance will not cover this test as part of annual physical "free labs"  Vaccines  Flu vaccine: recommended every fall (by Halloween!)  Shingles vaccine: Shingrix recommended after age 54  Pneumonia vaccines: Prevnar and Pneumovax recommended after age 41, sooner  if diabetes, COPD/asthma, others  Tetanus booster: Tdap recommended every 10 years - due 05/2018 Cancer screenings   Colon cancer screening: recommended at age 35, colonoscopy sooner if risk factors   Prostate cancer screening: recommendations vary, optional PSA blood test for men around age 22 Infection screenings . HIV: recommended screening at least once age 69-65 . Gonorrhea/Chlamydia: screening as needed . Hepatitis C: recommended for anyone born 52-1966 . TB: at-risk populations or as required by employer  Other . Bone Density Test: recommended for men at age 69, sooner depending on risk factors . Advanced Directive: Living Will and/or Healthcare Power of Attorney recommended for all adults, regardless of age or health                   For shortness of breath issues, EKG today looks fine, will get a chest x-ray, and as long as these and blood work are all normal, may consider having you back in the office for a lung function test to rule out COPD or other lung problem, what other steps may need to be taken.   Please note: Preventive care issues were addressed today as part of your annual wellness physical, and this care should be covered under your insurance. However there were other medical issues which were also addressed today and for which tests/labs were ordered, and insurance may bill you separately for  a "problem-based visit" for this care for breathing issues. Any questions or concerns about charges which may appear on your billing statement should be directed to your insurance company or to [facility] billing department, and they will contact our office if there are further concerns.           Visit summary with medication list and pertinent instructions was printed for patient to review. All questions at time of visit were answered - patient instructed to contact office with any additional concerns. ER/RTC precautions were reviewed with the patient.    Follow-up plan: Return for Lung function test in the next couple of weeks if all other results are normal.  Will call w/ result.     Note: Total time spent on problem-based portion of the visit was 15 minutes, greater than 50% of the visit was spent face-to-face counseling and coordinating care for the following: Shortness of breath on exertion  Please note: voice recognition software was used to produce this document, and typos may escape review. Please contact Dr. Lyn HollingsheadAlexander for any needed clarifications.

## 2017-12-04 ENCOUNTER — Other Ambulatory Visit: Payer: Self-pay | Admitting: Osteopathic Medicine

## 2017-12-04 DIAGNOSIS — R7989 Other specified abnormal findings of blood chemistry: Secondary | ICD-10-CM

## 2017-12-04 NOTE — Telephone Encounter (Signed)
Pt completed labs on 12/03/17 after OV.

## 2017-12-04 NOTE — Progress Notes (Signed)
Add-on labs

## 2017-12-08 LAB — HEMOGLOBIN A1C
EAG (MMOL/L): 7.3 (calc)
HEMOGLOBIN A1C: 6.2 %{Hb} — AB (ref <5.7)
MEAN PLASMA GLUCOSE: 131 (calc)

## 2017-12-08 LAB — COMPLETE METABOLIC PANEL WITH GFR
AG Ratio: 2 (calc) (ref 1.0–2.5)
ALT: 55 U/L — ABNORMAL HIGH (ref 9–46)
AST: 23 U/L (ref 10–40)
Albumin: 4.5 g/dL (ref 3.6–5.1)
Alkaline phosphatase (APISO): 63 U/L (ref 40–115)
BILIRUBIN TOTAL: 0.5 mg/dL (ref 0.2–1.2)
BUN: 16 mg/dL (ref 7–25)
CHLORIDE: 105 mmol/L (ref 98–110)
CO2: 27 mmol/L (ref 20–32)
Calcium: 9.8 mg/dL (ref 8.6–10.3)
Creat: 1.04 mg/dL (ref 0.60–1.35)
GFR, EST AFRICAN AMERICAN: 98 mL/min/{1.73_m2} (ref >=60)
GFR, EST NON AFRICAN AMERICAN: 85 mL/min/{1.73_m2} (ref >=60)
Globulin: 2.3 g/dL (calc) (ref 1.9–3.7)
Glucose, Bld: 139 mg/dL — ABNORMAL HIGH (ref 65–99)
Potassium: 4.8 mmol/L (ref 3.5–5.3)
Sodium: 143 mmol/L (ref 135–146)
TOTAL PROTEIN: 6.8 g/dL (ref 6.1–8.1)

## 2017-12-08 LAB — CBC
HEMATOCRIT: 44.2 % (ref 38.5–50.0)
HEMOGLOBIN: 15.4 g/dL (ref 13.2–17.1)
MCH: 28.7 pg (ref 27.0–33.0)
MCHC: 34.8 g/dL (ref 32.0–36.0)
MCV: 82.5 fL (ref 80.0–100.0)
MPV: 12.7 fL — ABNORMAL HIGH (ref 7.5–12.5)
PLATELETS: 188 10*3/uL (ref 140–400)
RBC: 5.36 10*6/uL (ref 4.20–5.80)
RDW: 12.6 % (ref 11.0–15.0)
WBC: 7.8 10*3/uL (ref 3.8–10.8)

## 2017-12-08 LAB — LIPID PANEL
Cholesterol: 232 mg/dL — ABNORMAL HIGH (ref <200)
HDL: 34 mg/dL — ABNORMAL LOW (ref >40)
LDL Cholesterol (Calc): 163 mg/dL (calc) — ABNORMAL HIGH
NON-HDL CHOLESTEROL (CALC): 198 mg/dL — AB (ref <130)
Total CHOL/HDL Ratio: 6.8 (calc) — ABNORMAL HIGH (ref <5.0)
Triglycerides: 190 mg/dL — ABNORMAL HIGH (ref <150)

## 2017-12-08 LAB — TSH: TSH: 0.01 m[IU]/L — AB (ref 0.40–4.50)

## 2017-12-08 LAB — TEST AUTHORIZATION

## 2017-12-08 LAB — T3: T3 TOTAL: 240 ng/dL — AB (ref 76–181)

## 2017-12-08 LAB — THYROID STIMULATING IMMUNOGLOBULIN: TSI: 267 % baseline — ABNORMAL HIGH (ref <140)

## 2017-12-08 LAB — T4, FREE: Free T4: 2.1 ng/dL — ABNORMAL HIGH (ref 0.8–1.8)

## 2018-02-15 ENCOUNTER — Encounter: Payer: Self-pay | Admitting: *Deleted

## 2018-02-15 ENCOUNTER — Other Ambulatory Visit: Payer: Self-pay

## 2018-02-15 ENCOUNTER — Emergency Department (INDEPENDENT_AMBULATORY_CARE_PROVIDER_SITE_OTHER): Payer: Commercial Managed Care - PPO

## 2018-02-15 ENCOUNTER — Emergency Department (INDEPENDENT_AMBULATORY_CARE_PROVIDER_SITE_OTHER)
Admission: EM | Admit: 2018-02-15 | Discharge: 2018-02-15 | Disposition: A | Discharge status: Home / Self Care | Payer: Commercial Managed Care - PPO | Source: Home / Self Care | Attending: Emergency Medicine | Admitting: Emergency Medicine

## 2018-02-15 DIAGNOSIS — R05 Cough: Secondary | ICD-10-CM

## 2018-02-15 DIAGNOSIS — J209 Acute bronchitis, unspecified: Secondary | ICD-10-CM | POA: Diagnosis not present

## 2018-02-15 HISTORY — DX: Disorder of thyroid, unspecified: E07.9

## 2018-02-15 MED ORDER — PROMETHAZINE-CODEINE 6.25-10 MG/5ML PO SYRP: ORAL_SOLUTION | ORAL | 0 refills | Status: DC

## 2018-02-15 MED ORDER — AZITHROMYCIN 250 MG PO TABS: ORAL_TABLET | ORAL | 0 refills | Status: DC

## 2018-02-15 MED ORDER — PREDNISONE 50 MG PO TABS: ORAL_TABLET | ORAL | 0 refills | Status: DC

## 2018-02-15 NOTE — Discharge Instructions (Signed)
Chest x-ray: Within normal limits.  No pneumonia.   Take medications as prescribed.-Prescriptions already sent to your pharmacy. Please read attached instruction/information. Follow-up with your PCP if not improving in 1 week, sooner if worse or new symptoms.

## 2018-02-15 NOTE — ED Provider Notes (Signed)
[name]    CSN: [id number] Arrival date & time: 02/15/18  0910     History   Chief Complaint Chief Complaint  Patient presents with  . Cough    HPI Ricky Ortiz is a 47 y.o. male.   HPI Here with wife.  2 weeks ago, started with minimal sinus congestion, but that progressed to chest congestion, cough productive of discolored sputum.  Noted some wheezing.  Occasionally shortness of breath with wheezing.  Currently denies shortness of breath.  Worsened when out in the cold weather yesterday.  Had productive and nonproductive hacking cough all night last night, wife reports. Denies history of asthma or COPD or chronic lung disease.  Non-smoker. Has had low-grade fever.  Some chills last night.  No nausea or vomiting or abdominal pain. Past Medical History:  Diagnosis Date  . Hyperlipidemia LDL goal < 160 02/04/2012  . Hypertriglyceridemia 02/04/2012  . Lack of concentration 01/27/2012  . Pre-diabetes 02/04/2012  . Thyroid disease     Patient Active Problem List   Diagnosis Date Noted  . Onychomycosis 11/21/2016  . Fatigue 11/21/2016  . Graves disease 01/31/2016  . Annual physical exam 01/14/2016  . Right hand tendonitis 09/04/2015  . Closed fracture of fifth metacarpal bone of right hand 12/22/2014  . Pre-diabetes 02/04/2012  . Hypertriglyceridemia 02/04/2012  . Lack of concentration 01/27/2012    Past Surgical History:  Procedure Laterality Date  . CLOSED REDUCTION RADIAL / ULNAR SHAFT FRACTURE    . VASECTOMY         Home Medications    Prior to Admission medications   Medication Sig Start Date End Date Taking? Authorizing Provider  azithromycin (ZITHROMAX Z-PAK) 250 MG tablet Take 2 tablets on day one, then 1 tablet daily on days 2 through 5 02/15/18   Lajean Manes, MD  predniSONE (DELTASONE) 50 MG tablet Take 1 daily with a meal for 5 days 02/15/18   Lajean Manes, MD  promethazine-codeine [facility] WITH CODEINE) 6.25-10 MG/5ML syrup Take  1-2 teaspoons every 4-6 hours as needed for cough. May cause drowsiness. 02/15/18   Lajean Manes, MD    Family History Family History  Problem Relation Age of Onset  . Alcohol abuse Unknown        grandparents  . Colon cancer Unknown        grandfather  . Hypertension Unknown        grandfather  . Hypothyroidism Mother   . Cancer Father        brain    Social History Social History   Tobacco Use  . Smoking status: Never Smoker  . Smokeless tobacco: Never Used  Substance Use Topics  . Alcohol use: Yes    Comment: rarely  . Drug use: No     Allergies   Vicodin [hydrocodone-acetaminophen]   Review of Systems Review of Systems  All other systems reviewed and are negative.    Physical Exam Triage Vital Signs ED Triage Vitals [02/15/18 0928]  Enc Vitals Group     BP 104/71     Pulse Rate 95     Resp 18     Temp 98.3 F (36.8 C)     Temp Source Oral     SpO2 97 %     Weight 227 lb (103 kg)     Height 5\' 10"  (1.778 m)     Head Circumference      Peak Flow      Pain Score 0  Pain Loc      Pain Edu?      Excl. in GC?    No data found.  Updated Vital Signs BP 104/71 (BP Location: Right Arm)   Pulse 95   Temp 98.3 F (36.8 C) (Oral)   Resp 18   Ht 5\' 10"  (1.778 m)   Wt 103 kg   SpO2 97%   BMI 32.57 kg/m   Visual Acuity Right Eye Distance:   Left Eye Distance:   Bilateral Distance:    Right Eye Near:   Left Eye Near:    Bilateral Near:     Physical Exam  Constitutional: He is oriented to person, place, and time. He appears well-developed and well-nourished. No distress.  HENT:  Head: Normocephalic and atraumatic.  Right Ear: Tympanic membrane normal.  Left Ear: Tympanic membrane normal.  Nose: Nose normal.  Mouth/Throat: Oropharynx is clear and moist. No oropharyngeal exudate.  Eyes: Right eye exhibits no discharge. Left eye exhibits no discharge. No scleral icterus.  Neck: Neck supple.  Cardiovascular: Normal rate, regular rhythm and  normal heart sounds.  Pulmonary/Chest: No respiratory distress. He has wheezes (Mild, late expiratory). He has rhonchi. He has no rales.  Lymphadenopathy:    He has no cervical adenopathy.  Neurological: He is alert and oriented to person, place, and time.  Skin: Skin is warm and dry.  Nursing note and vitals reviewed.    UC Treatments / Results  Labs (all labs ordered are listed, but only abnormal results are displayed) Labs Reviewed - No data to display  EKG None  Radiology Dg Chest 2 View  Result Date: 02/15/2018 CLINICAL DATA:  Cough x2wks. cough x 2 wks EXAM: CHEST - 2 VIEW COMPARISON:  12/03/2017. FINDINGS: Normal mediastinum and cardiac silhouette. Normal pulmonary vasculature. No evidence of effusion, infiltrate, or pneumothorax. No acute bony abnormality. IMPRESSION: Normal chest radiograph. Electronically Signed   By: Genevive BiStewart  Edmunds M.D.   On: 02/15/2018 09:59    Procedures Procedures (including critical care time)  Medications Ordered in UC Medications - No data to display  Initial Impression / Assessment and Plan / UC Course  I have reviewed the triage vital signs and the nursing notes.  Pertinent labs & imaging results that were available during my care of the patient were reviewed by me and considered in my medical decision making (see chart for details).     Chest x-ray shows no infiltrates or any acute abnormalities. Diagnosis is acute bronchitis with bronchospasm. After treatment options discussed, he agrees with treating with Zithromax Z-Pak, short oral prednisone burst.  At his request for strong cough med at night, prescribed small amount of promethazine with codeine.-Note, he has used this in the distant past without side effects . Follow-up with your primary care doctor in 5-7 days if not improving, or sooner if symptoms become worse. Precautions discussed. Red flags discussed. Questions invited and answered. Patient and wife voiced understanding  and agreement.  Final Clinical Impressions(s) / UC Diagnoses   Final diagnoses:  Acute bronchitis with bronchospasm   Discharge Instructions   None    ED Prescriptions    Medication Sig Dispense Auth. Provider   azithromycin (ZITHROMAX Z-PAK) 250 MG tablet Take 2 tablets on day one, then 1 tablet daily on days 2 through 5 1 each Lajean ManesMassey, Aviyon, MD   promethazine-codeine [facility](PHENERGAN WITH CODEINE) 6.25-10 MG/5ML syrup  (Status: Discontinued) Take 1-2 teaspoons every 4-6 hours as needed for cough. May cause drowsiness. 120 mL Lajean ManesMassey, Marquarius, MD  predniSONE (DELTASONE) 50 MG tablet Take 1 daily with a meal for 5 days 5 tablet Lajean Manes, MD   promethazine-codeine [facility] WITH CODEINE) 6.25-10 MG/5ML syrup Take 1-2 teaspoons every 4-6 hours as needed for cough. May cause drowsiness. 120 mL Lajean Manes, MD     Controlled Substance Prescriptions [place] Controlled Substance Registry consulted? Yes, I have consulted the [place] Controlled Substances Registry for this patient, and feel the risk/benefit ratio today is favorable for proceeding with this prescription for a controlled substance.   Lajean Manes, MD 02/15/18 1125

## 2018-02-15 NOTE — ED Triage Notes (Signed)
Pt c/o nonproductive cough x 2 wks; worse at night. Denies fever. He has taken Mucinex x 1 wk.

## 2018-03-16 ENCOUNTER — Encounter: Payer: Self-pay | Admitting: *Deleted

## 2018-03-16 ENCOUNTER — Emergency Department (INDEPENDENT_AMBULATORY_CARE_PROVIDER_SITE_OTHER)
Admission: EM | Admit: 2018-03-16 | Discharge: 2018-03-16 | Disposition: A | Discharge status: Home / Self Care | Payer: Commercial Managed Care - PPO | Source: Home / Self Care | Attending: Family Medicine | Admitting: Family Medicine

## 2018-03-16 ENCOUNTER — Other Ambulatory Visit: Payer: Self-pay

## 2018-03-16 DIAGNOSIS — J111 Influenza due to unidentified influenza virus with other respiratory manifestations: Secondary | ICD-10-CM

## 2018-03-16 DIAGNOSIS — J9801 Acute bronchospasm: Secondary | ICD-10-CM | POA: Diagnosis not present

## 2018-03-16 DIAGNOSIS — R69 Illness, unspecified: Secondary | ICD-10-CM | POA: Diagnosis not present

## 2018-03-16 MED ORDER — METHYLPREDNISOLONE ACETATE 80 MG/ML IJ SUSP
80.0000 mg | Freq: Once | INTRAMUSCULAR | Status: AC
  Administered 2018-03-16: 80 mg via INTRAMUSCULAR

## 2018-03-16 MED ORDER — DOXYCYCLINE HYCLATE 100 MG PO CAPS: 100.0000 mg | ORAL_CAPSULE | Freq: Two times a day (BID) | ORAL | 0 refills | Status: DC

## 2018-03-16 MED ORDER — GUAIFENESIN-CODEINE 100-10 MG/5ML PO SOLN: ORAL | 0 refills | Status: DC

## 2018-03-16 NOTE — ED Triage Notes (Signed)
Pt c/o cough x 03/10/18. The cough is the worst at night. He has taken Mucinex and Sudafed.

## 2018-03-16 NOTE — Discharge Instructions (Addendum)
Take plain guaifenesin (1200mg  extended release tabs such as Mucinex) twice daily, with plenty of water, for cough and congestion.  Continue Pseudoephedrine (30mg , one or two every 4 to 6 hours) for sinus congestion.  Get adequate rest.   May use Afrin nasal spray (or generic oxymetazoline) each morning for about 5 days and then discontinue.  Also recommend using saline nasal spray several times daily and saline nasal irrigation (AYR is a common brand).  Use Flonase nasal spray each morning after using Afrin nasal spray and saline nasal irrigation. Try warm salt water gargles for sore throat.  Stop all antihistamines for now, and other non-prescription cough/cold preparations. May take Tylenol as needed for pain, fever, etc.

## 2018-03-16 NOTE — ED Provider Notes (Signed)
[facility]    CSN: [id number] Arrival date & time: 03/16/18  [id number]     History   Chief Complaint Chief Complaint  Patient presents with  . Cough    HPI Ricky Ortiz is a 47 y.o. male.   One week ago patient developed flu-like illness including myalgias, headache, fever/chills/sweats, fatigue, and cough.  Also has mild nasal congestion and sore throat.  Cough is non-productive and worse at night.  No pleuritic pain or shortness of breath but he has developed intermittent wheezing.     Past Medical History:  Diagnosis Date  . Hyperlipidemia LDL goal < 160 02/04/2012  . Hypertriglyceridemia 02/04/2012  . Lack of concentration 01/27/2012  . Pre-diabetes 02/04/2012  . Thyroid disease     Patient Active Problem List   Diagnosis Date Noted  . Onychomycosis 11/21/2016  . Fatigue 11/21/2016  . Graves disease 01/31/2016  . Annual physical exam 01/14/2016  . Right hand tendonitis 09/04/2015  . Closed fracture of fifth metacarpal bone of right hand 12/22/2014  . Pre-diabetes 02/04/2012  . Hypertriglyceridemia 02/04/2012  . Lack of concentration 01/27/2012    Past Surgical History:  Procedure Laterality Date  . CLOSED REDUCTION RADIAL / ULNAR SHAFT FRACTURE    . VASECTOMY         Home Medications    Prior to Admission medications   Medication Sig Start Date End Date Taking? Authorizing Provider  doxycycline (VIBRAMYCIN) 100 MG capsule Take 1 capsule (100 mg total) by mouth 2 (two) times daily. Take with food. 03/16/18   Lattie HawBeese, Stephen A, MD  guaiFENesin-codeine 100-10 MG/5ML syrup Take 10mL by mouth at bedtime as needed for cough.  May repeat dose in 4 to 6 hours. 03/16/18   Lattie HawBeese, Stephen A, MD    Family History Family History  Problem Relation Age of Onset  . Alcohol abuse Other        grandparents  . Colon cancer Other        grandfather  . Hypertension Other        grandfather  . Hypothyroidism Mother   . Cancer Father        brain     Social History Social History   Tobacco Use  . Smoking status: Never Smoker  . Smokeless tobacco: Never Used  Substance Use Topics  . Alcohol use: Yes    Comment: rarely  . Drug use: No     Allergies   Vicodin [hydrocodone-acetaminophen]   Review of Systems Review of Systems + sore throat + cough No pleuritic pain + wheezing + nasal congestion + post-nasal drainage No sinus pain/pressure No itchy/red eyes No earache No hemoptysis No SOB + fever, + chills No nausea No vomiting No abdominal pain No diarrhea No urinary symptoms No skin rash + fatigue + myalgias + headache Used OTC meds without relief   Physical Exam Triage Vital Signs ED Triage Vitals  Enc Vitals Group     BP 03/16/18 0905 105/72     Pulse Rate 03/16/18 0905 96     Resp 03/16/18 0905 18     Temp 03/16/18 0905 98.2 F (36.8 C)     Temp Source 03/16/18 0905 Oral     SpO2 03/16/18 0905 95 %     Weight 03/16/18 0906 226 lb (102.5 kg)     Height 03/16/18 0906 5\' 10"  (1.778 m)     Head Circumference --      Peak Flow --      Pain Score  03/16/18 0906 0     Pain Loc --      Pain Edu? --      Excl. in GC? --    No data found.  Updated Vital Signs BP 105/72 (BP Location: Right Arm)   Pulse 96   Temp 98.2 F (36.8 C) (Oral)   Resp 18   Ht 5\' 10"  (1.778 m)   Wt 102.5 kg   SpO2 95%   BMI 32.43 kg/m   Visual Acuity Right Eye Distance:   Left Eye Distance:   Bilateral Distance:    Right Eye Near:   Left Eye Near:    Bilateral Near:     Physical Exam Nursing notes and Vital Signs reviewed. Appearance:  Patient appears stated age, and in no acute distress Eyes:  Pupils are equal, round, and reactive to light and accomodation.  Extraocular movement is intact.  Conjunctivae are not inflamed  Ears:  Canals normal.  Tympanic membranes normal.  Nose:  Mildly congested turbinates.  No sinus tenderness.  Pharynx:  Normal Neck:  Supple.  Enlarged posterior/lateral nodes are  palpated bilaterally, tender to palpation on the left.   Lungs:  Clear to auscultation.  Breath sounds are equal.  Moving air well. Heart:  Regular rate and rhythm without murmurs, rubs, or gallops.  Abdomen:  Nontender without masses or hepatosplenomegaly.  Bowel sounds are present.  No CVA or flank tenderness.  Extremities:  No edema.  Skin:  No rash present.    UC Treatments / Results  Labs (all labs ordered are listed, but only abnormal results are displayed) Labs Reviewed - No data to display  EKG None  Radiology No results found.  Procedures Procedures (including critical care time)  Medications Ordered in UC Medications  methylPREDNISolone acetate (DEPO-MEDROL) injection 80 mg (has no administration in time range)    Initial Impression / Assessment and Plan / UC Course  I have reviewed the triage vital signs and the nursing notes.  Pertinent labs & imaging results that were available during my care of the patient were reviewed by me and considered in my medical decision making (see chart for details).    Suspect influenza, but patient has missed window of opportunity to take Tamiflu. Because of wheezing, Administered Depo Medrol 80mg  IM.  Will begin empiric doxycycline. Rx for Robitussin AC for night time cough.  Followup with Family Doctor if not improved in one week.    Final Clinical Impressions(s) / UC Diagnoses   Final diagnoses:  Influenza-like illness  Bronchospasm, acute     Discharge Instructions     Take plain guaifenesin (1200mg  extended release tabs such as Mucinex) twice daily, with plenty of water, for cough and congestion.  Continue Pseudoephedrine (30mg , one or two every 4 to 6 hours) for sinus congestion.  Get adequate rest.   May use Afrin nasal spray (or generic oxymetazoline) each morning for about 5 days and then discontinue.  Also recommend using saline nasal spray several times daily and saline nasal irrigation (AYR is a common brand).   Use Flonase nasal spray each morning after using Afrin nasal spray and saline nasal irrigation. Try warm salt water gargles for sore throat.  Stop all antihistamines for now, and other non-prescription cough/cold preparations. May take Tylenol as needed for pain, fever, etc.      ED Prescriptions    Medication Sig Dispense Auth. Provider   doxycycline (VIBRAMYCIN) 100 MG capsule Take 1 capsule (100 mg total) by mouth 2 (two) times  daily. Take with food. 14 capsule Lattie HawBeese, Stephen A, MD   guaiFENesin-codeine 100-10 MG/5ML syrup Take 10mL by mouth at bedtime as needed for cough.  May repeat dose in 4 to 6 hours. 100 mL Lattie HawBeese, Stephen A, MD        Lattie HawBeese, Stephen A, MD 03/27/18 [phone]

## 2018-12-16 ENCOUNTER — Telehealth: Payer: Self-pay

## 2018-12-16 DIAGNOSIS — R5383 Other fatigue: Secondary | ICD-10-CM

## 2018-12-16 DIAGNOSIS — E781 Pure hyperglyceridemia: Secondary | ICD-10-CM

## 2018-12-16 DIAGNOSIS — R7303 Prediabetes: Secondary | ICD-10-CM

## 2018-12-16 DIAGNOSIS — E0500 Thyrotoxicosis with diffuse goiter without thyrotoxic crisis or storm: Secondary | ICD-10-CM

## 2018-12-16 DIAGNOSIS — Z0000 Encounter for general adult medical examination without abnormal findings: Secondary | ICD-10-CM

## 2018-12-16 NOTE — Telephone Encounter (Signed)
Pt called requesting annual lab order. As per pt, he only has orders for thyroid check. Pt has an appt scheduled next week with provider for annual check and wants to complete lab order before his visit. Pls advise, thanks.

## 2018-12-17 ENCOUNTER — Other Ambulatory Visit: Payer: Self-pay

## 2018-12-17 DIAGNOSIS — E0500 Thyrotoxicosis with diffuse goiter without thyrotoxic crisis or storm: Secondary | ICD-10-CM

## 2018-12-17 DIAGNOSIS — E781 Pure hyperglyceridemia: Secondary | ICD-10-CM

## 2018-12-17 DIAGNOSIS — R7303 Prediabetes: Secondary | ICD-10-CM

## 2018-12-17 DIAGNOSIS — R5383 Other fatigue: Secondary | ICD-10-CM

## 2018-12-17 DIAGNOSIS — Z0000 Encounter for general adult medical examination without abnormal findings: Secondary | ICD-10-CM

## 2018-12-17 NOTE — Telephone Encounter (Signed)
Patient is having physical.   DX added, labs signed, pt advised.

## 2018-12-17 NOTE — Telephone Encounter (Signed)
Labs ordered and pended.  Is this for a physical?  Was not sure what code to use for these

## 2018-12-18 LAB — CBC
HCT: 44.7 % (ref 38.5–50.0)
Hemoglobin: 15.2 g/dL (ref 13.2–17.1)
MCH: 29.5 pg (ref 27.0–33.0)
MCHC: 34 g/dL (ref 32.0–36.0)
MCV: 86.8 fL (ref 80.0–100.0)
MPV: 12.3 fL (ref 7.5–12.5)
Platelets: 191 10*3/uL (ref 140–400)
RBC: 5.15 10*6/uL (ref 4.20–5.80)
RDW: 13.3 % (ref 11.0–15.0)
WBC: 10 10*3/uL (ref 3.8–10.8)

## 2018-12-18 LAB — COMPLETE METABOLIC PANEL WITH GFR
AG Ratio: 1.8 (calc) (ref 1.0–2.5)
ALT: 47 U/L — ABNORMAL HIGH (ref 9–46)
AST: 22 U/L (ref 10–40)
Albumin: 4.6 g/dL (ref 3.6–5.1)
Alkaline phosphatase (APISO): 67 U/L (ref 36–130)
BUN: 19 mg/dL (ref 7–25)
CO2: 26 mmol/L (ref 20–32)
Calcium: 9.6 mg/dL (ref 8.6–10.3)
Chloride: 102 mmol/L (ref 98–110)
Creat: 1.28 mg/dL (ref 0.60–1.35)
GFR, Est African American: 76 mL/min/{1.73_m2} (ref >=60)
GFR, Est Non African American: 65 mL/min/{1.73_m2} (ref >=60)
Globulin: 2.5 g/dL (calc) (ref 1.9–3.7)
Glucose, Bld: 112 mg/dL — ABNORMAL HIGH (ref 65–99)
Potassium: 4.7 mmol/L (ref 3.5–5.3)
Sodium: 137 mmol/L (ref 135–146)
Total Bilirubin: 0.4 mg/dL (ref 0.2–1.2)
Total Protein: 7.1 g/dL (ref 6.1–8.1)

## 2018-12-18 LAB — HEMOGLOBIN A1C
Hgb A1c MFr Bld: 6 % of total Hgb — ABNORMAL HIGH (ref <5.7)
Mean Plasma Glucose: 126 (calc)
eAG (mmol/L): 7 (calc)

## 2018-12-18 LAB — LIPID PANEL
Cholesterol: 244 mg/dL — ABNORMAL HIGH (ref <200)
HDL: 37 mg/dL — ABNORMAL LOW (ref >=40)
LDL Cholesterol (Calc): 169 mg/dL (calc) — ABNORMAL HIGH
Non-HDL Cholesterol (Calc): 207 mg/dL (calc) — ABNORMAL HIGH (ref <130)
Total CHOL/HDL Ratio: 6.6 (calc) — ABNORMAL HIGH (ref <5.0)
Triglycerides: 227 mg/dL — ABNORMAL HIGH (ref <150)

## 2018-12-18 LAB — T3, FREE: T3, Free: 3.4 pg/mL (ref 2.3–4.2)

## 2018-12-18 LAB — TSH+FREE T4: TSH W/REFLEX TO FT4: 1.15 mIU/L (ref 0.40–4.50)

## 2018-12-23 ENCOUNTER — Ambulatory Visit (INDEPENDENT_AMBULATORY_CARE_PROVIDER_SITE_OTHER): Payer: Commercial Managed Care - PPO | Admitting: Osteopathic Medicine

## 2018-12-23 ENCOUNTER — Other Ambulatory Visit: Payer: Self-pay

## 2018-12-23 ENCOUNTER — Encounter: Payer: Self-pay | Admitting: Osteopathic Medicine

## 2018-12-23 VITALS — BP 133/83 | HR 96 | Temp 98.2°F | Wt 235.8 lb

## 2018-12-23 DIAGNOSIS — Z23 Encounter for immunization: Secondary | ICD-10-CM

## 2018-12-23 DIAGNOSIS — E782 Mixed hyperlipidemia: Secondary | ICD-10-CM

## 2018-12-23 DIAGNOSIS — R7309 Other abnormal glucose: Secondary | ICD-10-CM

## 2018-12-23 DIAGNOSIS — B351 Tinea unguium: Secondary | ICD-10-CM

## 2018-12-23 DIAGNOSIS — Z0000 Encounter for general adult medical examination without abnormal findings: Secondary | ICD-10-CM | POA: Diagnosis not present

## 2018-12-23 DIAGNOSIS — R030 Elevated blood-pressure reading, without diagnosis of hypertension: Secondary | ICD-10-CM

## 2018-12-23 DIAGNOSIS — R5382 Chronic fatigue, unspecified: Secondary | ICD-10-CM

## 2018-12-23 MED ORDER — ATORVASTATIN CALCIUM 20 MG PO TABS: 20.0000 mg | ORAL_TABLET | Freq: Every day | ORAL | 3 refills | Status: DC

## 2018-12-23 MED ORDER — TERBINAFINE HCL 250 MG PO TABS: 250.0000 mg | ORAL_TABLET | Freq: Every day | ORAL | 0 refills | Status: AC

## 2018-12-23 MED ORDER — CICLOPIROX 8 % EX SOLN: Freq: Every day | CUTANEOUS | 0 refills | Status: DC

## 2018-12-23 NOTE — Progress Notes (Signed)
HPI: Ricky Ortiz is a 48 y.o. male who  has a past medical history of Hyperlipidemia LDL goal < 160 (02/04/2012), Hypertriglyceridemia (02/04/2012), Lack of concentration (01/27/2012), Pre-diabetes (02/04/2012), and Thyroid disease.  [facility], 12/23/18,  for chief complaint of: Annual  Patient here for annual physical / wellness exam.  See preventive care reviewed as below.  Recent labs reviewed in detail with the patient.  Cholesterol elevated! Relatively good w/ diet/exercise    Elevated fasting sugar, A1c in prediabetic range  Additional concerns today include:   Some fatigue issues over the past couple of months, no dyspnea on exertion, no major sleep problems, no depression  Elevated blood pressure  Requests refill medications for onychomycosis      Past medical, surgical, social and family history reviewed:  Patient Active Problem List   Diagnosis Date Noted  . Onychomycosis 11/21/2016  . Fatigue 11/21/2016  . Graves disease 01/31/2016  . Annual physical exam 01/14/2016  . Right hand tendonitis 09/04/2015  . Closed fracture of fifth metacarpal bone of right hand 12/22/2014  . Pre-diabetes 02/04/2012  . Hypertriglyceridemia 02/04/2012  . Lack of concentration 01/27/2012    Past Surgical History:  Procedure Laterality Date  . CLOSED REDUCTION RADIAL / ULNAR SHAFT FRACTURE    . VASECTOMY      Social History   Tobacco Use  . Smoking status: Never Smoker  . Smokeless tobacco: Never Used  Substance Use Topics  . Alcohol use: Yes    Comment: rarely    Family History  Problem Relation Age of Onset  . Alcohol abuse Other        grandparents  . Colon cancer Other        grandfather  . Hypertension Other        grandfather  . Hypothyroidism Mother   . Cancer Father        brain     Current medication list and allergy/intolerance information reviewed:    No current outpatient medications on  file.   No current facility-administered medications for this visit.     Allergies  Allergen Reactions  . Vicodin [Hydrocodone-Acetaminophen]     dizziness      Review of Systems:  Constitutional:  No  fever, no chills, No recent illness, No unintentional weight changes. +significant fatigue.   HEENT: No  headache, no vision change, no hearing change, No sore throat, No  sinus pressure  Cardiac: No  chest pain, No  pressure, No palpitations, No  Orthopnea  Respiratory:  No  shortness of breath. No  Cough  Gastrointestinal: No  abdominal pain, No  nausea, No  vomiting,  No  blood in stool, No  diarrhea, No  constipation   Musculoskeletal: No new myalgia/arthralgia  Skin: No  Rash, No other wounds/concerning lesions  Genitourinary: No  incontinence, No  abnormal genital bleeding, No abnormal genital discharge  Hem/Onc: No  easy bruising/bleeding, No  abnormal lymph node  Endocrine: No cold intolerance,  No heat intolerance. No polyuria/polydipsia/polyphagia   Neurologic: No  weakness, No  dizziness, No  slurred speech/focal weakness/facial droop  Psychiatric: No  concerns with depression, No  concerns with anxiety, No sleep problems, No mood problems  Exam:  BP 133/83 (BP Location: Right Arm, Patient Position: Sitting, Cuff Size: Normal)   Pulse 96   Temp 98.2 F (36.8 C) (Oral)   Wt 235 lb 12.8 oz (107 kg)   BMI 33.83 kg/m   Constitutional: VS see  above. General Appearance: alert, well-developed, well-nourished, NAD  Eyes: Normal lids and conjunctive, non-icteric sclera  Ears, Nose, Mouth, Throat: TM normal bilaterally.   Neck: No masses, trachea midline. No thyroid enlargement. No tenderness/mass appreciated. No lymphadenopathy  Respiratory: Normal respiratory effort. no wheeze, no rhonchi, no rales  Cardiovascular: S1/S2 normal, no murmur, no rub/gallop auscultated. RRR. No lower extremity edema.   Gastrointestinal: Nontender, no masses. No hepatomegaly, no  splenomegaly. No hernia appreciated. Bowel sounds normal. Rectal exam deferred.   Musculoskeletal: Gait normal. No clubbing/cyanosis of digits.   Neurological: Normal balance/coordination. No tremor. No cranial nerve deficit on limited exam. Motor and sensation intact and symmetric. Cerebellar reflexes intact.   Skin: warm, dry, intact. No rash/ulcer. No concerning nevi or subq nodules on limited exam.    Psychiatric: Normal judgment/insight. Normal mood and affect. Oriented x3.    No results found for this or any previous visit (from the past 72 hour(s)).  No results found.   ASSESSMENT/PLAN: The primary encounter diagnosis was Annual physical exam. Diagnoses of Need for influenza vaccination, Need for Tdap vaccination, Onychomycosis, Mixed hyperlipidemia, Chronic fatigue, and Elevated hemoglobin A1c were also pertinent to this visit.   Orders Placed This Encounter  Procedures  . Flu Vaccine QUAD 6+ mos PF IM (Fluarix Quad PF)  . Tdap vaccine greater than or equal to 7yo IM    Meds ordered this encounter  Medications  . terbinafine (LAMISIL) 250 MG tablet    Sig: Take 1 tablet (250 mg total) by mouth daily. For 12 weeks for toenail fungus    Dispense:  90 tablet    Refill:  0  . ciclopirox (PENLAC) 8 % solution    Sig: Apply topically at bedtime. Apply over nail and surrounding skin. Apply daily over previous coat. After seven (7) days, may remove with alcohol and continue cycle.    Dispense:  6.6 mL    Refill:  0  . atorvastatin (LIPITOR) 20 MG tablet    Sig: Take 1 tablet (20 mg total) by mouth daily.    Dispense:  90 tablet    Refill:  3   The 10-year ASCVD risk score Denman George DC Jr., et al., 2013) is: 6.2%   Values used to calculate the score:     Age: 21 years     Sex: Male     Is Non-Hispanic African American: No     Diabetic: No     Tobacco smoker: No     Systolic Blood Pressure: 133 mmHg     Is BP treated: No     HDL Cholesterol: 37 mg/dL     Total Cholesterol:  244 mg/dL  Patient Instructions  General Preventive Care  Most recent routine screening lipids/other labs: done!  Everyone should have blood pressure checked once per year.   Tobacco: don't!   Alcohol: responsible moderation is ok for most adults - if you have concerns about your alcohol intake, please talk to me!   Exercise: as tolerated to reduce risk of cardiovascular disease and diabetes. Strength training will also prevent osteoporosis.   Mental health: if need for mental health care (medicines, counseling, other), or concerns about moods, please let me know!   Sexual health: if need for STD testing, or if concerns with libido/pain problems, please let me know!   Advanced Directive: Living Will and/or Healthcare Power of Attorney recommended for all adults, regardless of age or health.  Vaccines  Flu vaccine: recommended for almost everyone, every fall.   Shingles  vaccine: Shingrix recommended after age 26.  Pneumonia vaccines: Prevnar and Pneumovax recommended after age 72, or sooner if certain medical conditions.  Tetanus booster: Tdap recommended every 10 years.  Cancer screenings   Colon cancer screening: recommended for everyone at age 82, but some folks need a colonoscopy sooner if risk factors   Prostate cancer screening: PSA blood test around age 16  Lung cancer screening: not needed for non-smokers  Infection screenings . HIV: recommended screening at least once age 65-65, more often as needed. . Gonorrhea/Chlamydia: screening as needed . Hepatitis C: recommended for anyone born 13-1966 . TB: certain at-risk populations, or depending on work requirements and/or travel history Other . Bone Density Test: recommended for men at age 58, sooner depending on risk factors . Abdominal Aortic Aneurysm: screening with ultrasound recommended once for men age 64-75 who have ever smoked         Visit summary with medication list and pertinent instructions was  printed for patient to review. All questions at time of visit were answered - patient instructed to contact office with any additional concerns or updates. ER/RTC precautions were reviewed with the patient.     Please note: voice recognition software was used to produce this document, and typos may escape review. Please contact Dr. Sheppard Coil for any needed clarifications.     Follow-up plan: Return in about 3 months (around 03/25/2019) for recheck BP and review labs / cholesterol / sugar, labs prior to visit (orders are in) .

## 2018-12-23 NOTE — Patient Instructions (Signed)
General Preventive Care  Most recent routine screening lipids/other labs: done!  Everyone should have blood pressure checked once per year.   Tobacco: don't!   Alcohol: responsible moderation is ok for most adults - if you have concerns about your alcohol intake, please talk to me!   Exercise: as tolerated to reduce risk of cardiovascular disease and diabetes. Strength training will also prevent osteoporosis.   Mental health: if need for mental health care (medicines, counseling, other), or concerns about moods, please let me know!   Sexual health: if need for STD testing, or if concerns with libido/pain problems, please let me know!   Advanced Directive: Living Will and/or Healthcare Power of Attorney recommended for all adults, regardless of age or health.  Vaccines  Flu vaccine: recommended for almost everyone, every fall.   Shingles vaccine: Shingrix recommended after age 95.  Pneumonia vaccines: Prevnar and Pneumovax recommended after age 77, or sooner if certain medical conditions.  Tetanus booster: Tdap recommended every 10 years.  Cancer screenings   Colon cancer screening: recommended for everyone at age 82, but some folks need a colonoscopy sooner if risk factors   Prostate cancer screening: PSA blood test around age 39  Lung cancer screening: not needed for non-smokers  Infection screenings . HIV: recommended screening at least once age 88-65, more often as needed. . Gonorrhea/Chlamydia: screening as needed . Hepatitis C: recommended for anyone born 47-1966 . TB: certain at-risk populations, or depending on work requirements and/or travel history Other . Bone Density Test: recommended for men at age 64, sooner depending on risk factors . Abdominal Aortic Aneurysm: screening with ultrasound recommended once for men age 58-75 who have ever smoked

## 2018-12-29 ENCOUNTER — Telehealth: Payer: Commercial Managed Care - PPO | Admitting: Family

## 2018-12-29 DIAGNOSIS — L089 Local infection of the skin and subcutaneous tissue, unspecified: Secondary | ICD-10-CM | POA: Diagnosis not present

## 2018-12-29 DIAGNOSIS — L255 Unspecified contact dermatitis due to plants, except food: Secondary | ICD-10-CM | POA: Diagnosis not present

## 2018-12-29 MED ORDER — DOXYCYCLINE HYCLATE 100 MG PO TABS: 100.0000 mg | ORAL_TABLET | Freq: Two times a day (BID) | ORAL | 0 refills | Status: DC

## 2018-12-29 MED ORDER — PREDNISONE 10 MG (21) PO TBPK: ORAL_TABLET | ORAL | 0 refills | Status: DC

## 2018-12-29 NOTE — Progress Notes (Signed)
We are sorry that you are not feeing well.  Here is how we plan to help!  Based on what you have shared with me it looks like you have had an allergic reaction to the oily resin from a group of plants.  This resin is very sticky, so it easily attaches to your skin, clothing, tools equipment, and pet's fur.    This blistering rash is often called poison ivy rash although it can come from contact with the leaves, stems and roots of poison ivy, poison oak and poison sumac.  The oily resin contains urushiol (u-ROO-she-ol) that produces a skin rash on exposed skin.  The severity of the rash depends on the amount of urushiol that gets on your skin.  A section of skin with more urushiol on it may develop a rash sooner.  The rash usually develops 12-48 hours after exposure and can last two to three weeks.  Your skin must come in direct contact with the plant's oil to be affected.  Blister fluid doesn't spread the rash.  However, if you come into contact with a piece of clothing or pet fur that has urushiol on it, the rash may spread out.  You can also transfer the oil to other parts of your body with your fingers.  Often the rash looks like a straight line because of the way the plant brushes against your skin.    I have developed the following plan to treat your condition I suspect that a bacterial infection has developed at the rash site, and I have given you both an oral steroid and an oral antibiotic.  I have sent a prednisone dose pack to your chosen pharmacy. Be sure to follow the instructions carefully and complete the entire prescription.  You may use Benadryl or Caladryl topical lotions to sooth the itch and remember cool, not hot, showers and baths can help relieve the itching!  I have also sent in a prescription for an antibiotic: doxycycline 100 mg twice a day for 10 days.   Please schedule an appointment with your primary care provider to follow up on your skin infection within 2 weeks.  If drainage  and red streaking continue to spread you should be seen urgently.  Make sure that the clothes you were wearing and any towels or sheets that may have come in contact with the oil (urushiol) are washed in detergent and hot water.      What can you do to prevent this rash?  Avoid the plants.  Learn how to identify poison ivy, poison oak and poison sumac in all seasons.  When hiking or engaging in other activities that might expose you to these plants, try to stay on cleared pathways.  If camping, make sure you pitch your tent in an area free of these plants.  Keep pets from running through wooded areas so that urushiol doesn't accidentally stick to their fur, which you may touch.  Remove or kill the plants.  In your yard, you can get rid of poison ivy by applying an herbicide or pulling it out of the ground, including the roots, while wearing heavy gloves.  Afterward remove the gloves and thoroughly wash them and your hands.  Don't burn poison ivy or related plants because the urushiol can be carried by smoke.  Wear protective clothing.  If needed, protect your skin by wearing socks, boots, pants, long sleeves and vinyl gloves.  Wash your skin right away.  Washing off the oil with   soap and water within 30 minutes of exposure may reduce your chances of getting a poison ivy rash.  Even washing after an hour or so can help reduce the severity of the rash.  If you walk through some poison ivy and then later touch your shoes, you may get some urushiol on your hands, which may then transfer to your face or body by touching or rubbing.  If the contaminated object isn't cleaned, the urushiol on it can still cause a skin reaction years later.    Be careful not to reuse towels after you have washed your skin.  Also carefully wash clothing in detergent and hot water to remove all traces of the oil.  Handle contaminated clothing carefully so you don't transfer the urushiol to yourself, furniture, rugs or  appliances.  Remember that pets can carry the oil on their fur and paws.  If you think your pet may be contaminated with urushiol, put on some long rubber gloves and give your pet a bath.  Finally, be careful not to burn these plants as the smoke can contain traces of the oil.  Inhaling the smoke may result in difficulty breathing. If that occurred you should see a physician as soon as possible.  See your doctor right away if:   The reaction is severe or widespread  You inhaled the smoke from burning poison ivy and are having difficulty breathing  Your skin continues to swell  The rash affects your eyes, mouth or genitals  Blisters are oozing pus  You develop a fever greater than 100 F (37.8 C)  The rash doesn't get better within a few weeks.  If you scratch the poison ivy rash, bacteria under your fingernails may cause the skin to become infected.  See your doctor if pus starts oozing from the blisters.  Treatment generally includes antibiotics.  Poison ivy treatments are usually limited to self-care methods.  And the rash typically goes away on its own in two to three weeks.     If the rash is widespread or results in a large number of blisters, your doctor may prescribe an oral corticosteroid, such as prednisone.  If a bacterial infection has developed at the rash site, your doctor may give you a prescription for an oral antibiotic.  MAKE SURE YOU   Understand these instructions.  Will watch your condition.  Will get help right away if you are not doing well or get worse.  Thank you for choosing an e-visit. Your e-visit answers were reviewed by a board certified advanced clinical practitioner to complete your personal care plan. Depending upon the condition, your plan could have included both over the counter or prescription medications.  Please review your pharmacy choice. If there is a problem you may use MyChart messaging to have the prescription routed to another  pharmacy.   Your safety is important to us. If you have drug allergies check your prescription carefully.  You can use MyChart to ask questions about today's visit, request a non-urgent call back, or ask for a work or school excuse for 24 hours related to this e-Visit. If it has been greater than 24 hours you will need to follow up with your provider, or enter a new e-Visit to address those concerns.   You will get an email in the next two days asking about your experience. I hope that your e-visit has been valuable and will speed your recovery Thank you for choosing an e-visit.   Approximately 5   minutes was spent documenting and reviewing patient's chart.        

## 2019-01-07 NOTE — Progress Notes (Signed)
Received fax from [facility] that Penlac solution was approved through 01/07/2020. Pharmacy aware.

## 2019-03-24 ENCOUNTER — Encounter: Payer: Self-pay | Admitting: Osteopathic Medicine

## 2019-03-25 ENCOUNTER — Ambulatory Visit: Payer: Commercial Managed Care - PPO | Admitting: Osteopathic Medicine

## 2019-03-28 ENCOUNTER — Encounter: Payer: Self-pay | Admitting: Osteopathic Medicine

## 2019-03-28 ENCOUNTER — Other Ambulatory Visit: Payer: Self-pay

## 2019-03-28 ENCOUNTER — Ambulatory Visit (INDEPENDENT_AMBULATORY_CARE_PROVIDER_SITE_OTHER): Payer: BC Managed Care – PPO | Admitting: Osteopathic Medicine

## 2019-03-28 VITALS — BP 122/83 | HR 88 | Temp 98.0°F | Wt 236.0 lb

## 2019-03-28 DIAGNOSIS — R7309 Other abnormal glucose: Secondary | ICD-10-CM | POA: Diagnosis not present

## 2019-03-28 DIAGNOSIS — E781 Pure hyperglyceridemia: Secondary | ICD-10-CM | POA: Diagnosis not present

## 2019-03-28 DIAGNOSIS — R7303 Prediabetes: Secondary | ICD-10-CM | POA: Diagnosis not present

## 2019-03-28 DIAGNOSIS — Z23 Encounter for immunization: Secondary | ICD-10-CM | POA: Diagnosis not present

## 2019-03-28 LAB — POCT GLYCOSYLATED HEMOGLOBIN (HGB A1C): Hemoglobin A1C: 6.1 % — AB (ref 4.0–5.6)

## 2019-03-28 NOTE — Progress Notes (Signed)
HPI: Ricky Ortiz is a 49 y.o. male who  has a past medical history of Hyperlipidemia LDL goal < 160 (02/04/2012), Hypertriglyceridemia (02/04/2012), Lack of concentration (01/27/2012), Pre-diabetes (02/04/2012), and Thyroid disease.  he presents to [facility] today, 03/28/19,  for chief complaint of:  BP follow-up  Labs: lipid, glc (ordered but not yet done!)   Last visit 3 mos ago for annual At that time:  A1C prediabetic range 6.0, today is 6.1   Cholesterol high, elevated TG, we started Lipitor 20 mg daily   Elevated BP compared to usual numbers  Needed refill for onychomycosis Rx Today  No concerns today  BP looking better  We need labs!    BP Readings from Last 3 Encounters:  03/28/19 122/83  12/23/18 133/83  03/16/18 105/72   Wt Readings from Last 3 Encounters:  03/28/19 236 lb 0.6 oz (107.1 kg)  12/23/18 235 lb 12.8 oz (107 kg)  03/16/18 226 lb (102.5 kg)   Shingles vaccine 1st shot today    At today's visit 03/28/19 ... PMH, PSH, FH reviewed and updated as needed.  Current medication list and allergy/intolerance hx reviewed and updated as needed. (See remainder of HPI, ROS, Phys Exam below)   No results found.  Results for orders placed or performed in visit on 03/28/19 (from the past 72 hour(s))  POCT HgB A1C     Status: Abnormal   Collection Time: 03/28/19 11:06 AM  Result Value Ref Range   Hemoglobin A1C 6.1 (A) 4.0 - 5.6 %   HbA1c POC (<> result, manual entry)     HbA1c, POC (prediabetic range)     HbA1c, POC (controlled diabetic range)            ASSESSMENT/PLAN: The primary encounter diagnosis was Prediabetes. Diagnoses of Elevated hemoglobin A1c, Need for shingles vaccine, and Hypertriglyceridemia were also pertinent to this visit.   Labs today!   Monitor BP and A1C q 6 mo  Orders Placed This Encounter  Procedures  . Varicella-zoster vaccine IM (Shingrix)  . POCT HgB A1C     No orders of  the defined types were placed in this encounter.   There are no Patient Instructions on file for this visit.    Follow-up plan: Return in about 6 months (around 09/25/2019) for in office - recheck A1C, otherwise depends on lab results! .                                                 ################################################# ################################################# ################################################# #################################################    Current Meds  Medication Sig  . atorvastatin (LIPITOR) 20 MG tablet Take 1 tablet (20 mg total) by mouth daily.  . ciclopirox (PENLAC) 8 % solution Apply topically at bedtime. Apply over nail and surrounding skin. Apply daily over previous coat. After seven (7) days, may remove with alcohol and continue cycle.    Allergies  Allergen Reactions  . Vicodin [Hydrocodone-Acetaminophen]     dizziness       Review of Systems:  Constitutional: No recent illness  Musculoskeletal: No new myalgia/arthralgia  Exam:  BP 122/83 (BP Location: Left Arm, Patient Position: Sitting, Cuff Size: Large)   Pulse 88   Temp 98 F (36.7 C) (Oral)   Wt 236 lb 0.6 oz (107.1 kg)   BMI 33.87 kg/m   Constitutional: VS see above. General  Appearance: alert, well-developed, well-nourished, NA  Neck: No masses, trachea midline.   Respiratory: Normal respiratory effort. no wheeze, no rhonchi, no rales  Cardiovascular: S1/S2 normal, no murmur, no rub/gallop auscultated. RRR.   Musculoskeletal: Gait normal. Symmetric and independent movement of all extremities  Neurological: Normal balance/coordination. No tremor.  Skin: warm, dry, intact.   Psychiatric: Normal judgment/insight. Normal mood and affect. Oriented x3.       Visit summary with medication list and pertinent instructions was printed for patient to review, patient was advised to alert [place] if any updates  are needed. All questions at time of visit were answered - patient instructed to contact office with any additional concerns. ER/RTC precautions were reviewed with the patient and understanding verbalized.    Please note: voice recognition software was used to produce this document, and typos may escape review. Please contact Dr. Sheppard Coil for any needed clarifications.    Follow up plan: Return in about 6 months (around 09/25/2019) for in office - recheck A1C, otherwise depends on lab results! Marland Kitchen

## 2019-03-29 LAB — COMPLETE METABOLIC PANEL WITH GFR
AG Ratio: 1.9 (calc) (ref 1.0–2.5)
ALT: 40 U/L (ref 9–46)
AST: 20 U/L (ref 10–35)
Albumin: 4.7 g/dL (ref 3.6–5.1)
Alkaline phosphatase (APISO): 63 U/L (ref 35–144)
BUN: 11 mg/dL (ref 7–25)
CO2: 27 mmol/L (ref 20–32)
Calcium: 9.9 mg/dL (ref 8.6–10.3)
Chloride: 103 mmol/L (ref 98–110)
Creat: 1.04 mg/dL (ref 0.70–1.33)
GFR, Est African American: 97 mL/min/{1.73_m2} (ref >=60)
GFR, Est Non African American: 83 mL/min/{1.73_m2} (ref >=60)
Globulin: 2.5 g/dL (calc) (ref 1.9–3.7)
Glucose, Bld: 114 mg/dL — ABNORMAL HIGH (ref 65–99)
Potassium: 4.7 mmol/L (ref 3.5–5.3)
Sodium: 140 mmol/L (ref 135–146)
Total Bilirubin: 0.7 mg/dL (ref 0.2–1.2)
Total Protein: 7.2 g/dL (ref 6.1–8.1)

## 2019-03-29 LAB — LIPID PANEL
Cholesterol: 201 mg/dL — ABNORMAL HIGH (ref <200)
HDL: 36 mg/dL — ABNORMAL LOW (ref >=40)
LDL Cholesterol (Calc): 135 mg/dL (calc) — ABNORMAL HIGH
Non-HDL Cholesterol (Calc): 165 mg/dL (calc) — ABNORMAL HIGH (ref <130)
Total CHOL/HDL Ratio: 5.6 (calc) — ABNORMAL HIGH (ref <5.0)
Triglycerides: 162 mg/dL — ABNORMAL HIGH (ref <150)

## 2019-03-29 LAB — TESTOSTERONE: Testosterone: 224 ng/dL — ABNORMAL LOW (ref 250–827)

## 2019-04-02 ENCOUNTER — Other Ambulatory Visit: Payer: Self-pay | Admitting: Osteopathic Medicine

## 2019-04-02 MED ORDER — ATORVASTATIN CALCIUM 40 MG PO TABS: 40.0000 mg | ORAL_TABLET | Freq: Every day | ORAL | 3 refills | Status: DC

## 2019-04-04 ENCOUNTER — Encounter: Payer: Self-pay | Admitting: Osteopathic Medicine

## 2019-04-04 DIAGNOSIS — R7989 Other specified abnormal findings of blood chemistry: Secondary | ICD-10-CM

## 2019-04-15 LAB — PSA, TOTAL WITH REFLEX TO PSA, FREE: PSA, Total: 0.3 ng/mL (ref <=4.0)

## 2019-06-13 ENCOUNTER — Ambulatory Visit: Payer: BC Managed Care – PPO | Attending: Internal Medicine

## 2019-06-13 DIAGNOSIS — Z23 Encounter for immunization: Secondary | ICD-10-CM

## 2019-06-13 NOTE — Progress Notes (Signed)
   Covid-19 Vaccination Clinic  Name:  Ricky Ortiz    MRN: [id number] DOB: 1970/10/31  06/13/2019  [name] was observed post Covid-19 immunization for 15 minutes without incident. He was provided with Vaccine Information Sheet and instruction to access the V-Safe system.   [name] was instructed to call 911 with any severe reactions post vaccine: Marland Kitchen Difficulty breathing  . Swelling of face and throat  . A fast heartbeat  . A bad rash all over body  . Dizziness and weakness   Immunizations Administered    Name Date Dose VIS Date Route   Pfizer COVID-19 Vaccine 06/13/2019  2:57 PM 0.3 mL 02/25/2019 Intramuscular   Manufacturer: [employer], [employer]   Lot: [id number]   NDC: [address]-1

## 2019-07-06 ENCOUNTER — Ambulatory Visit: Payer: BC Managed Care – PPO | Attending: Internal Medicine

## 2019-07-06 DIAGNOSIS — Z23 Encounter for immunization: Secondary | ICD-10-CM

## 2019-07-06 NOTE — Progress Notes (Signed)
   Covid-19 Vaccination Clinic  Name:  Ricky Ortiz    MRN: [id number] DOB: Jul 18, 1970  07/06/2019  Ricky Ortiz was observed post Covid-19 immunization for 15 minutes without incident. He was provided with Vaccine Information Sheet and instruction to access the V-Safe system.   Ricky Ortiz was instructed to call 911 with any severe reactions post vaccine: Marland Kitchen Difficulty breathing  . Swelling of face and throat  . A fast heartbeat  . A bad rash all over body  . Dizziness and weakness   Immunizations Administered    Name Date Dose VIS Date Route   Pfizer COVID-19 Vaccine 07/06/2019  2:23 PM 0.3 mL 05/11/2018 Intramuscular   Manufacturer: [employer], [employer]   Lot: [id number]   NDC: [address]-1

## 2019-08-16 ENCOUNTER — Other Ambulatory Visit: Payer: Self-pay

## 2019-08-16 ENCOUNTER — Emergency Department (INDEPENDENT_AMBULATORY_CARE_PROVIDER_SITE_OTHER): Payer: BC Managed Care – PPO

## 2019-08-16 ENCOUNTER — Emergency Department (INDEPENDENT_AMBULATORY_CARE_PROVIDER_SITE_OTHER)
Admission: EM | Admit: 2019-08-16 | Discharge: 2019-08-16 | Disposition: A | Discharge status: Home / Self Care | Payer: BC Managed Care – PPO | Source: Home / Self Care

## 2019-08-16 DIAGNOSIS — M545 Low back pain, unspecified: Secondary | ICD-10-CM

## 2019-08-16 DIAGNOSIS — R109 Unspecified abdominal pain: Secondary | ICD-10-CM | POA: Diagnosis not present

## 2019-08-16 LAB — POCT URINALYSIS DIP (MANUAL ENTRY)
Bilirubin, UA: NEGATIVE
Blood, UA: NEGATIVE
Glucose, UA: NEGATIVE mg/dL
Ketones, POC UA: NEGATIVE mg/dL
Leukocytes, UA: NEGATIVE
Nitrite, UA: NEGATIVE
Protein Ur, POC: NEGATIVE mg/dL
Spec Grav, UA: <=1.005 — AB (ref 1.010–1.025)
Urobilinogen, UA: 0.2 E.U./dL
pH, UA: 5.5 (ref 5.0–8.0)

## 2019-08-16 MED ORDER — METHOCARBAMOL 500 MG PO TABS: 500.0000 mg | ORAL_TABLET | Freq: Four times a day (QID) | ORAL | 0 refills | Status: DC

## 2019-08-16 MED ORDER — DICLOFENAC SODIUM 75 MG PO TBEC: 75.0000 mg | DELAYED_RELEASE_TABLET | Freq: Two times a day (BID) | ORAL | 1 refills | Status: DC

## 2019-08-16 NOTE — ED Provider Notes (Signed)
[name]    CSN: [id number] Arrival date & time: 08/16/19  1011      History   Chief Complaint Chief Complaint  Patient presents with  . Flank Pain    HPI Ricky Ortiz is a 49 y.o. male.   The history is provided by the patient. No language interpreter was used.  Flank Pain This is a new problem. The current episode started more than 1 week ago. The problem occurs constantly. The problem has been gradually worsening. Pertinent negatives include no chest pain and no abdominal pain. Nothing aggravates the symptoms. Nothing relieves the symptoms.   Pt reports pain is worse in the am when he wakes up.  Pt reports pain is worse when he sits back straight.  Pt is worried that he has a kidney stone. Past Medical History:  Diagnosis Date  . Hyperlipidemia LDL goal < 160 02/04/2012  . Hypertriglyceridemia 02/04/2012  . Lack of concentration 01/27/2012  . Pre-diabetes 02/04/2012  . Thyroid disease     Patient Active Problem List   Diagnosis Date Noted  . Elevated hemoglobin A1c 12/23/2018  . Elevated blood pressure reading 12/23/2018  . Onychomycosis 11/21/2016  . Fatigue 11/21/2016  . Graves disease 01/31/2016  . Annual physical exam 01/14/2016  . Right hand tendonitis 09/04/2015  . Closed fracture of fifth metacarpal bone of right hand 12/22/2014  . Prediabetes 02/04/2012  . Hypertriglyceridemia 02/04/2012  . Lack of concentration 01/27/2012    Past Surgical History:  Procedure Laterality Date  . CLOSED REDUCTION RADIAL / ULNAR SHAFT FRACTURE    . VASECTOMY         Home Medications    Prior to Admission medications   Medication Sig Start Date End Date Taking? Authorizing Provider  atorvastatin (LIPITOR) 40 MG tablet Take 1 tablet (40 mg total) by mouth daily. 04/02/19  Yes Emeterio Reeve, DO  ciclopirox [facility]) 8 % solution Apply topically at bedtime. Apply over nail and surrounding skin. Apply daily over previous coat. After seven (7) days, may  remove with alcohol and continue cycle. 12/23/18   Emeterio Reeve, DO  diclofenac (VOLTAREN) 75 MG EC tablet Take 1 tablet (75 mg total) by mouth 2 (two) times daily. 08/16/19   Fransico Meadow, PA-C  methocarbamol (ROBAXIN) 500 MG tablet Take 1 tablet (500 mg total) by mouth 4 (four) times daily. 08/16/19   Fransico Meadow, PA-C    Family History Family History  Problem Relation Age of Onset  . Alcohol abuse Other        grandparents  . Colon cancer Other        grandfather  . Hypertension Other        grandfather  . Hypothyroidism Mother   . Cancer Father        brain    Social History Social History   Tobacco Use  . Smoking status: Never Smoker  . Smokeless tobacco: Never Used  Substance Use Topics  . Alcohol use: Not Currently    Comment: rarely  . Drug use: No     Allergies   Vicodin [hydrocodone-acetaminophen]   Review of Systems Review of Systems  Cardiovascular: Negative for chest pain.  Gastrointestinal: Negative for abdominal pain.  Genitourinary: Positive for flank pain.  All other systems reviewed and are negative.    Physical Exam Triage Vital Signs ED Triage Vitals  Enc Vitals Group     BP 08/16/19 1029 (!) 136/92     Pulse Rate 08/16/19 1029 89  Resp 08/16/19 1029 18     Temp 08/16/19 1029 98.8 F (37.1 C)     Temp Source 08/16/19 1029 Oral     SpO2 08/16/19 1029 100 %     Weight --      Height --      Head Circumference --      Peak Flow --      Pain Score 08/16/19 1027 1     Pain Loc --      Pain Edu? --      Excl. in GC? --    No data found.  Updated Vital Signs BP (!) 136/92 (BP Location: Right Arm)   Pulse 89   Temp 98.8 F (37.1 C) (Oral)   Resp 18   SpO2 100%   Visual Acuity Right Eye Distance:   Left Eye Distance:   Bilateral Distance:    Right Eye Near:   Left Eye Near:    Bilateral Near:     Physical Exam Vitals and nursing note reviewed.  Constitutional:      Appearance: He is well-developed.  HENT:      Head: Normocephalic and atraumatic.  Eyes:     Conjunctiva/sclera: Conjunctivae normal.  Cardiovascular:     Rate and Rhythm: Normal rate and regular rhythm.     Heart sounds: No murmur.  Pulmonary:     Effort: Pulmonary effort is normal. No respiratory distress.     Breath sounds: Normal breath sounds.  Abdominal:     Palpations: Abdomen is soft.     Tenderness: There is no abdominal tenderness.  Musculoskeletal:        General: Normal range of motion.     Cervical back: Neck supple.  Skin:    General: Skin is warm and dry.  Neurological:     General: No focal deficit present.     Mental Status: He is alert.  Psychiatric:        Mood and Affect: Mood normal.      UC Treatments / Results  Labs (all labs ordered are listed, but only abnormal results are displayed) Labs Reviewed  POCT URINALYSIS DIP (MANUAL ENTRY) - Abnormal; Notable for the following components:      Result Value   Color, UA light yellow (*)    Spec Grav, UA <=1.005 (*)    All other components within normal limits    EKG   Radiology CT Renal Stone Study  Result Date: 08/16/2019 CLINICAL DATA:  Bilateral flank pain EXAM: CT ABDOMEN AND PELVIS WITHOUT CONTRAST TECHNIQUE: Multidetector CT imaging of the abdomen and pelvis was performed following the standard protocol without IV contrast. COMPARISON:  None. FINDINGS: Lower chest: No acute abnormality. Hepatobiliary: No focal liver abnormality is seen. No gallstones, gallbladder wall thickening, or biliary dilatation. Pancreas: Unremarkable. Spleen: Too small to characterize low-attenuation lesion. Otherwise unremarkable. Adrenals/Urinary Tract: Bilateral renal cysts. No renal calculi or hydronephrosis. Ureters are unremarkable. The bladder is unremarkable for degree of distension. Stomach/Bowel: Stomach is within normal limits. The bowel is normal in caliber. Sigmoid diverticulosis. Normal appendix. Vascular/Lymphatic: No significant vascular findings are  present. No enlarged abdominal or pelvic lymph nodes. Reproductive: Prostate is unremarkable. Other: No ascites.  Abdominal wall is unremarkable. Musculoskeletal: No significant or acute osseous abnormality. IMPRESSION: No acute abnormality or findings to account for reported symptoms. Electronically Signed   By: Guadlupe Spanish M.D.   On: 08/16/2019 11:36    Procedures Procedures (including critical care time)  Medications Ordered in UC Medications -  No data to display  Initial Impression / Assessment and Plan / UC Course  I have reviewed the triage vital signs and the nursing notes.  Pertinent labs & imaging results that were available during my care of the patient were reviewed by me and considered in my medical decision making (see chart for details).     MDM:  Ct no stone.  Pt counseled on probable back pain.  Pt given Rx for voltaren and robaxin.  Pt advised to follow up with Dr. Lyn Hollingshead for recheck in 1 week  Final Clinical Impressions(s) / UC Diagnoses   Final diagnoses:  Acute midline low back pain without sciatica     Discharge Instructions     Return if any problems.  See Dr. Lyn Hollingshead for recheck in 1 week    ED Prescriptions    Medication Sig Dispense Auth. Provider   methocarbamol (ROBAXIN) 500 MG tablet Take 1 tablet (500 mg total) by mouth 4 (four) times daily. 20 tablet [employer], [place]   diclofenac (VOLTAREN) 75 MG EC tablet Take 1 tablet (75 mg total) by mouth 2 (two) times daily. 20 tablet Elson Areas, [place]     PDMP not reviewed this encounter.  An After Visit Summary was printed and given to the patient.    Elson Areas, [place] 08/16/19 1206

## 2019-08-16 NOTE — ED Triage Notes (Signed)
Patient presents to Urgent Care with complaints of bilateral flank pain, progressively worse since the past 3 weeks. Patient reports it is not tender to the touch, certain movements make the pain worse.  Pt denies urinary sx.

## 2019-08-16 NOTE — Discharge Instructions (Signed)
Return if any problems.  See Dr. Lyn Hollingshead for recheck in 1 week

## 2019-08-18 ENCOUNTER — Ambulatory Visit: Payer: BC Managed Care – PPO | Admitting: Osteopathic Medicine

## 2019-09-22 ENCOUNTER — Encounter: Payer: Self-pay | Admitting: Osteopathic Medicine

## 2019-09-22 DIAGNOSIS — R7989 Other specified abnormal findings of blood chemistry: Secondary | ICD-10-CM

## 2019-09-22 DIAGNOSIS — R7303 Prediabetes: Secondary | ICD-10-CM

## 2019-09-22 DIAGNOSIS — E781 Pure hyperglyceridemia: Secondary | ICD-10-CM

## 2019-09-22 DIAGNOSIS — E782 Mixed hyperlipidemia: Secondary | ICD-10-CM

## 2019-09-28 ENCOUNTER — Ambulatory Visit: Payer: BC Managed Care – PPO | Admitting: Osteopathic Medicine

## 2019-10-04 LAB — COMPLETE METABOLIC PANEL WITH GFR
AG Ratio: 2.2 (calc) (ref 1.0–2.5)
ALT: 48 U/L — ABNORMAL HIGH (ref 9–46)
AST: 25 U/L (ref 10–35)
Albumin: 4.7 g/dL (ref 3.6–5.1)
Alkaline phosphatase (APISO): 63 U/L (ref 35–144)
BUN: 15 mg/dL (ref 7–25)
CO2: 28 mmol/L (ref 20–32)
Calcium: 9.3 mg/dL (ref 8.6–10.3)
Chloride: 107 mmol/L (ref 98–110)
Creat: 0.86 mg/dL (ref 0.70–1.33)
GFR, Est African American: 117 mL/min/{1.73_m2} (ref >=60)
GFR, Est Non African American: 101 mL/min/{1.73_m2} (ref >=60)
Globulin: 2.1 g/dL (calc) (ref 1.9–3.7)
Glucose, Bld: 106 mg/dL — ABNORMAL HIGH (ref 65–99)
Potassium: 5.1 mmol/L (ref 3.5–5.3)
Sodium: 138 mmol/L (ref 135–146)
Total Bilirubin: 0.9 mg/dL (ref 0.2–1.2)
Total Protein: 6.8 g/dL (ref 6.1–8.1)

## 2019-10-04 LAB — HEMOGLOBIN A1C
Hgb A1c MFr Bld: 6.1 % of total Hgb — ABNORMAL HIGH (ref <5.7)
Mean Plasma Glucose: 128 (calc)
eAG (mmol/L): 7.1 (calc)

## 2019-10-04 LAB — LIPID PANEL
Cholesterol: 167 mg/dL (ref <200)
HDL: 33 mg/dL — ABNORMAL LOW (ref >=40)
LDL Cholesterol (Calc): 109 mg/dL (calc) — ABNORMAL HIGH
Non-HDL Cholesterol (Calc): 134 mg/dL (calc) — ABNORMAL HIGH (ref <130)
Total CHOL/HDL Ratio: 5.1 (calc) — ABNORMAL HIGH (ref <5.0)
Triglycerides: 137 mg/dL (ref <150)

## 2019-10-04 LAB — TESTOSTERONE: Testosterone: 267 ng/dL (ref 250–827)

## 2019-10-04 LAB — CBC
HCT: 43.9 % (ref 38.5–50.0)
Hemoglobin: 15 g/dL (ref 13.2–17.1)
MCH: 29.8 pg (ref 27.0–33.0)
MCHC: 34.2 g/dL (ref 32.0–36.0)
MCV: 87.3 fL (ref 80.0–100.0)
MPV: 12.6 fL — ABNORMAL HIGH (ref 7.5–12.5)
Platelets: 166 10*3/uL (ref 140–400)
RBC: 5.03 10*6/uL (ref 4.20–5.80)
RDW: 13.1 % (ref 11.0–15.0)
WBC: 8.5 10*3/uL (ref 3.8–10.8)

## 2019-10-04 LAB — TSH: TSH: 0.8 mIU/L (ref 0.40–4.50)

## 2019-10-10 ENCOUNTER — Encounter: Payer: Self-pay | Admitting: Osteopathic Medicine

## 2019-10-10 ENCOUNTER — Ambulatory Visit (INDEPENDENT_AMBULATORY_CARE_PROVIDER_SITE_OTHER): Payer: BC Managed Care – PPO | Admitting: Osteopathic Medicine

## 2019-10-10 ENCOUNTER — Other Ambulatory Visit: Payer: Self-pay

## 2019-10-10 VITALS — BP 113/76 | HR 86 | Wt 233.0 lb

## 2019-10-10 DIAGNOSIS — E291 Testicular hypofunction: Secondary | ICD-10-CM | POA: Diagnosis not present

## 2019-10-10 DIAGNOSIS — Z1211 Encounter for screening for malignant neoplasm of colon: Secondary | ICD-10-CM | POA: Diagnosis not present

## 2019-10-10 DIAGNOSIS — B351 Tinea unguium: Secondary | ICD-10-CM | POA: Diagnosis not present

## 2019-10-10 DIAGNOSIS — R7303 Prediabetes: Secondary | ICD-10-CM | POA: Diagnosis not present

## 2019-10-10 LAB — POCT GLYCOSYLATED HEMOGLOBIN (HGB A1C)
HbA1c POC (<> result, manual entry): 6.1 % (ref 4.0–5.6)
HbA1c, POC (controlled diabetic range): 6.1 % (ref 0.0–7.0)
HbA1c, POC (prediabetic range): 6.1 % (ref 5.7–6.4)
Hemoglobin A1C: 6.1 % — AB (ref 4.0–5.6)

## 2019-10-10 MED ORDER — CICLOPIROX 8 % EX SOLN: Freq: Every day | CUTANEOUS | 3 refills | Status: DC

## 2019-10-10 MED ORDER — HYDROCORTISONE ACETATE 25 MG RE SUPP
25.0000 mg | Freq: Two times a day (BID) | RECTAL | 2 refills | Status: DC | PRN
Start: 2019-10-10 — End: 2021-11-25

## 2019-10-10 MED ORDER — TESTOSTERONE CYPIONATE 100 MG/ML IM SOLN
200.0000 mg | INTRAMUSCULAR | 1 refills | Status: DC
Start: 2019-10-10 — End: 2019-12-01

## 2019-10-10 MED ORDER — SYRINGE LUER LOCK 3 ML MISC
1 refills | Status: DC
Start: 2019-10-10 — End: 2021-11-25

## 2019-10-10 MED ORDER — NEEDLE (DISP) 22G X 1-1/2" MISC
0 refills | Status: DC
Start: 2019-10-10 — End: 2021-11-25

## 2019-10-10 MED ORDER — NEEDLE (DISP) 18G X 1" MISC
0 refills | Status: DC
Start: 2019-10-10 — End: 2021-11-25

## 2019-10-10 NOTE — Progress Notes (Signed)
HPI: Ricky Ortiz is a 49 y.o. male who  has a past medical history of Hyperlipidemia LDL goal < 160 (02/04/2012), Hypertriglyceridemia (02/04/2012), Lack of concentration (01/27/2012), Pre-diabetes (02/04/2012), and Thyroid disease.  [facility]10[facility],  for chief complaint of: Follow up prediabetes, low testosterone  Patient reports he has been doing well with his diet. Since February he has cut out ice cream and soda. He also states he has been more active, and uses his wife's peloton regularly. He expresses frustration because he has not had any weight loss or change in his A1c with these diet changes. He states he has polydipsia in the morning, but attributes this to caffeine use late at night. He has also noted some feeling of incomplete bladder emptying, stating sometimes it feels like he still has to urinate after going to the bathroom  He also notes concern for low testosterone. He states he has had decreased libido, fatigue, and ED for the last several months. He states his testosterone was low at his last visit as well. He states he tried an OTC testosterone booster, which he does not feel improved his symptoms.   He is interested in getting a referral for a colonoscopy. He also notes he had a flare of his hemorrhoids when he started using the peloton, so he is hoping they can remove it when he has his colonoscopy.   Otherwise, he denies headache, vision changes, chest pain, shortness of breath, abdominal pain, nausea, vomiting, diarrhea, paresthesias, leg swelling.      Past medical, surgical, social and family history reviewed:  Patient Active Problem List   Diagnosis Date Noted  . Elevated blood pressure reading 12/23/2018  . Onychomycosis 11/21/2016  . Fatigue 11/21/2016  . Graves disease 01/31/2016  . Annual physical exam 01/14/2016  . Right hand tendonitis 09/04/2015  . Closed fracture of fifth metacarpal bone  of right hand 12/22/2014  . Prediabetes 02/04/2012  . Hypertriglyceridemia 02/04/2012  . Lack of concentration 01/27/2012    Past Surgical History:  Procedure Laterality Date  . CLOSED REDUCTION RADIAL / ULNAR SHAFT FRACTURE    . VASECTOMY      Social History   Tobacco Use  . Smoking status: Never Smoker  . Smokeless tobacco: Never Used  Substance Use Topics  . Alcohol use: Not Currently    Comment: rarely    Family History  Problem Relation Age of Onset  . Alcohol abuse Other        grandparents  . Colon cancer Other        grandfather  . Hypertension Other        grandfather  . Hypothyroidism Mother   . Cancer Father        brain     Current medication list and allergy/intolerance information reviewed:    Current Outpatient Medications  Medication Sig Dispense Refill  . atorvastatin (LIPITOR) 40 MG tablet Take 1 tablet (40 mg total) by mouth daily. 90 tablet 3  . ciclopirox (PENLAC) 8 % solution Apply topically at bedtime. Apply over nail and surrounding skin. Apply daily over previous coat. After seven (7) days, may remove with alcohol and continue cycle. 12 mL 3  . diclofenac (VOLTAREN) 75 MG EC tablet Take 1 tablet (75 mg total) by mouth 2 (two) times daily. 20 tablet 1  . methocarbamol (ROBAXIN) 500 MG tablet Take 1 tablet (500 mg total) by mouth 4 (four) times daily. 20 tablet 0  . NEEDLE,  DISP, 18 G 18G X 1" MISC As directed to DRAW Testosterone from vial 25 each 0  . NEEDLE, DISP, 22 G 22G X 1-1/2" MISC As directed to ADMINISTER Testosterone IM injection 25 each 0  . Syringe, Disposable, (SYRINGE LUER LOCK) 3 ML MISC as directed for Testosterone administration 25 each 1  . testosterone cypionate (DEPOTESTOTERONE CYPIONATE) 100 MG/ML injection Inject 2 mLs (200 mg total) into the muscle every 14 (fourteen) days. For IM use only 5 mL 1   No current facility-administered medications for this visit.    Allergies  Allergen Reactions  . Vicodin  [Hydrocodone-Acetaminophen]     dizziness      Review of Systems:  Constitutional:  No  fever, no chills, No unintentional weight changes. + fatigue.   HEENT: No  headache, no vision change  Cardiac: No  chest pain  Respiratory:  No  shortness of breath. No  Cough  Gastrointestinal: No  abdominal pain, No  nausea, No  vomiting, No  diarrhea  Musculoskeletal: No new myalgia/arthralgia  Skin: No  Rash, No other wounds/concerning lesions  Genitourinary: +feeling incomplete bladder emptying. No  incontinence  Hem/Onc: No  easy bruising/bleeding, No  abnormal lymph node  Endocrine: No cold intolerance,  No heat intolerance. No polyuria/polydipsia/polyphagia   Neurologic: No  weakness, No  dizziness  Psychiatric: No  concerns with depression, No  concerns with anxiety, No sleep problems, No mood problems  Exam:  BP 113/76 (BP Location: Right Arm, Patient Position: Sitting)   Pulse 86   Wt (!) 233 lb (105.7 kg)   SpO2 97%   BMI 33.43 kg/m   Constitutional: VS see above. General Appearance: alert, well-developed, well-nourished, NAD  Eyes: Normal lids and conjunctive, non-icteric sclera  Neck: No masses, trachea midline.  Respiratory: Normal respiratory effort. no wheeze, no rhonchi, no rales  Cardiovascular: S1/S2 normal, no murmur, no rub/gallop auscultated. RRR. No lower extremity edema.  Gastrointestinal: Nontender, no masses  Musculoskeletal: Mild yellow discoloration to great toenail on bilateral feet. Gait normal. No clubbing/cyanosis of digits.   Neurological: Normal balance/coordination. No tremor. No cranial nerve deficit on limited exam.  Skin: warm, dry, intact. No rash/ulcer. No concerning nevi or subq nodules on limited exam.   Psychiatric: Normal judgment/insight. Normal mood and affect. Oriented x3.    Results for orders placed or performed in visit on 10/10/19 (from the past 72 hour(s))  POCT HgB A1C     Status: Abnormal   Collection Time:  10/10/19  3:02 PM  Result Value Ref Range   Hemoglobin A1C 6.1 (A) 4.0 - 5.6 %   HbA1c POC (<> result, manual entry) 6.1 4.0 - 5.6 %   HbA1c, POC (prediabetic range) 6.1 5.7 - 6.4 %   HbA1c, POC (controlled diabetic range) 6.1 0.0 - 7.0 %    No results found.        ASSESSMENT/PLAN: The primary encounter diagnosis was Testosterone deficiency in male. Diagnoses of Prediabetes, Onychomycosis, and Colon cancer screening were also pertinent to this visit.   Prediabetes  HbA1c 6.1 today (same as 03/28/19). No significant weight loss since last appointment, despite diet and exercise changes. Pt reported he is under stress related to his son's wedding at his home this weekend. We discussed the role of stress and low testosterone in weight loss, and pt is hopeful that weight loss will be easier when testosterone and stress improve.  Recheck at annual physical in October.   Low testosterone  Testosterone 267 today (224 in January).  Check baseline PSA today.  Prescribed testosterone 200mg  injections every 2 weeks and associated supplies. Pt will return to clinic for injection education.  Discussed risks/benefits including prostate cancer and heart disease  Dyslipidemia  Lipid panel today improved from previous. Advised patient to continue current dose of atorvastatin and recheck in October. The 10-year ASCVD risk score November DC Jr., et al., 2013) is: 3.4%  Health Maintenance  Colon cancer screening- discussed options for screening with patient and he opted for colonoscopy. Referral placed.   Reviewed labs done last week. CBC, CMP, TSH unremarkable.   Refilled ciclopirox for onychomycosis  Orders Placed This Encounter  Procedures  . PSA, Total with Reflex to PSA, Free  . POCT HgB A1C    Meds ordered this encounter  Medications  . testosterone cypionate (DEPOTESTOTERONE CYPIONATE) 100 MG/ML injection    Sig: Inject 2 mLs (200 mg total) into the muscle every 14 (fourteen)  days. For IM use only    Dispense:  5 mL    Refill:  1  . NEEDLE, DISP, 18 G 18G X 1" MISC    Sig: As directed to DRAW Testosterone from vial    Dispense:  25 each    Refill:  0  . NEEDLE, DISP, 22 G 22G X 1-1/2" MISC    Sig: As directed to ADMINISTER Testosterone IM injection    Dispense:  25 each    Refill:  0  . ciclopirox (PENLAC) 8 % solution    Sig: Apply topically at bedtime. Apply over nail and surrounding skin. Apply daily over previous coat. After seven (7) days, may remove with alcohol and continue cycle.    Dispense:  12 mL    Refill:  3  . Syringe, Disposable, (SYRINGE LUER LOCK) 3 ML MISC    Sig: as directed for Testosterone administration    Dispense:  25 each    Refill:  1    There are no Patient Instructions on file for this visit.      Visit summary with medication list and pertinent instructions was printed for patient to review. All questions at time of visit were answered - patient instructed to contact office with any additional concerns or updates. ER/RTC precautions were reviewed with the patient.   Note: Total time spent 40 minutes, greater than 50% of the visit was spent face-to-face counseling and coordinating care for the above diagnoses listed in assessment/plan.   Please note: voice recognition software was used to produce this document, and typos may escape review. Please contact Dr. 2014 for any needed clarifications.     Follow-up plan: Return in about 3 months (around 01/10/2020) for [place] (call week prior to visit for lab orders).   [place], [facility] MS3

## 2019-10-11 ENCOUNTER — Telehealth: Payer: Self-pay | Admitting: Osteopathic Medicine

## 2019-10-11 LAB — PSA, TOTAL WITH REFLEX TO PSA, FREE: PSA, Total: 0.3 ng/mL (ref <=4.0)

## 2019-10-11 NOTE — Telephone Encounter (Signed)
As long as you remain covered by your prescription drug plan and there are no changes to your plan benefits, this request is approved for the following time period: 10/11/2019 - 10/10/2020 Testosterone, Pharmacy aware.

## 2019-10-20 ENCOUNTER — Ambulatory Visit (INDEPENDENT_AMBULATORY_CARE_PROVIDER_SITE_OTHER): Payer: BC Managed Care – PPO | Admitting: Osteopathic Medicine

## 2019-10-20 ENCOUNTER — Other Ambulatory Visit: Payer: Self-pay

## 2019-10-20 VITALS — BP 116/82 | HR 80

## 2019-10-20 DIAGNOSIS — E291 Testicular hypofunction: Secondary | ICD-10-CM | POA: Diagnosis not present

## 2019-10-20 MED ORDER — TESTOSTERONE CYPIONATE 200 MG/ML IM SOLN
200.0000 mg | Freq: Once | INTRAMUSCULAR | Status: AC
Start: 2019-10-20 — End: 2019-10-20
  Administered 2019-10-20: 200 mg via INTRAMUSCULAR

## 2019-10-20 NOTE — Progress Notes (Signed)
Patient is here for testosterone injection and to teach wife administration of testosterone. Denies chest pain, shortness of breath, headaches, and problems with medication or mood changes.   Patient's wife instructed on proper technique to administer testosterone injection. He did bring his own medication, documented in [facility]. He tolerated testosterone 200 mg injection to RUOQ well without complications. Patient advised to administer next injection in 14 days and call with any questions.

## 2019-11-29 ENCOUNTER — Encounter: Payer: Self-pay | Admitting: Osteopathic Medicine

## 2019-11-29 DIAGNOSIS — R7989 Other specified abnormal findings of blood chemistry: Secondary | ICD-10-CM

## 2019-12-01 MED ORDER — TESTOSTERONE CYPIONATE 100 MG/ML IM SOLN: 200.0000 mg | INTRAMUSCULAR | 5 refills | Status: DC

## 2019-12-19 ENCOUNTER — Encounter: Payer: BC Managed Care – PPO | Admitting: Osteopathic Medicine

## 2020-01-09 LAB — CBC
HCT: 49.4 % (ref 38.5–50.0)
Hemoglobin: 17 g/dL (ref 13.2–17.1)
MCH: 29.7 pg (ref 27.0–33.0)
MCHC: 34.4 g/dL (ref 32.0–36.0)
MCV: 86.4 fL (ref 80.0–100.0)
MPV: 12.2 fL (ref 7.5–12.5)
Platelets: 214 10*3/uL (ref 140–400)
RBC: 5.72 10*6/uL (ref 4.20–5.80)
RDW: 12.9 % (ref 11.0–15.0)
WBC: 9.4 10*3/uL (ref 3.8–10.8)

## 2020-01-09 LAB — PSA, TOTAL WITH REFLEX TO PSA, FREE: PSA, Total: 0.4 ng/mL (ref <=4.0)

## 2020-01-09 LAB — TESTOSTERONE: Testosterone: 1004 ng/dL — ABNORMAL HIGH (ref 250–827)

## 2020-01-10 ENCOUNTER — Encounter: Payer: Self-pay | Admitting: Osteopathic Medicine

## 2020-01-11 ENCOUNTER — Encounter: Payer: Self-pay | Admitting: Osteopathic Medicine

## 2020-01-11 ENCOUNTER — Telehealth (INDEPENDENT_AMBULATORY_CARE_PROVIDER_SITE_OTHER): Payer: BC Managed Care – PPO | Admitting: Osteopathic Medicine

## 2020-01-11 VITALS — Wt 230.0 lb

## 2020-01-11 DIAGNOSIS — R7303 Prediabetes: Secondary | ICD-10-CM

## 2020-01-11 DIAGNOSIS — E0500 Thyrotoxicosis with diffuse goiter without thyrotoxic crisis or storm: Secondary | ICD-10-CM

## 2020-01-11 DIAGNOSIS — E781 Pure hyperglyceridemia: Secondary | ICD-10-CM | POA: Diagnosis not present

## 2020-01-11 DIAGNOSIS — Z0000 Encounter for general adult medical examination without abnormal findings: Secondary | ICD-10-CM | POA: Diagnosis not present

## 2020-01-11 DIAGNOSIS — E291 Testicular hypofunction: Secondary | ICD-10-CM

## 2020-01-11 NOTE — Patient Instructions (Addendum)
General Preventive Care  Most recent routine screening labs: done 01/06/20. Repeat labs in 6 months.   Blood counts good  Testosterone levels up    PSA (prostate) negative   Blood pressure goal 130/80 or less.   Tobacco: don't!   Alcohol: responsible moderation is ok for most adults - if you have concerns about your alcohol intake, please talk to me!   Exercise: as tolerated to reduce risk of cardiovascular disease and diabetes. Strength training will also prevent osteoporosis.   Mental health: if need for mental health care (medicines, counseling, other), or concerns about moods, please let me know!   Sexual / Reproductive health: if need for STD testing, or if concerns with libido/pain problems, please let me know!  Advanced Directive: Living Will and/or Healthcare Power of Attorney recommended for all adults, regardless of age or health.  Vaccines  Flu vaccine: for almost everyone, every fall.   Shingles vaccine: after age 8 - due for shot #2 of 2  Pneumonia vaccines: after age 24.  Tetanus booster: every 10 years - due 2030  COVID vaccine: THANKS for getting your vaccine! :)  Cancer screenings   Colon cancer screening: for everyone age 69-75. Colonoscopy available for all, many people also qualify for the Cologuard stool test. [facility] phone number: Phone: [phone]   Prostate cancer screening: PSA blood test age 50-71  Lung cancer screening: not needed for non-smokers  Infection screenings  . HIV: recommended screening at least once age 63-65, more often as needed. . Gonorrhea/Chlamydia: screening as needed . Hepatitis C: recommended once for everyone age 21-75 . TB: certain at-risk populations, or depending on work requirements and/or travel history Other . Bone Density Test: recommended for men at age 46, sooner depending on risk factors . Abdominal Aortic Aneurysm: screening with ultrasound recommended once for men age 71-75 who have ever smoked

## 2020-01-11 NOTE — Progress Notes (Signed)
Virtual Visit via Video (App used: MyChart) Note  I connected with      Ricky Ortiz on 01/11/20 at 9:44 AM  by a telemedicine application and verified that I am speaking with the correct person using two identifiers.  Patient is at home I am in office   I discussed the limitations of evaluation and management by telemedicine and the availability of in person appointments. The patient expressed understanding and agreed to proceed.  History of Present Illness: Ricky Ortiz is a 49 y.o. male who would like to discuss Annual physical        Observations/Objective: Wt 230 lb (104.3 kg)   BMI 33.00 kg/m  BP Readings from Last 3 Encounters:  10/20/19 116/82  10/10/19 113/76  08/16/19 (!) 136/92   Exam: Normal Speech.  NAD  Lab and Radiology Results No results found for this or any previous visit (from the past 72 hour(s)). No results found.     Assessment and Plan: 49 y.o. male with The primary encounter diagnosis was Annual physical exam. Diagnoses of Graves disease, Hypertriglyceridemia, Testosterone deficiency in male, and Prediabetes were also pertinent to this visit.   PDMP not reviewed this encounter. Orders Placed This Encounter  Procedures  . CBC  . COMPLETE METABOLIC PANEL WITH GFR  . Lipid panel  . Hemoglobin A1c  . PSA, Total with Reflex to PSA, Free  . Testosterone  . TSH  . T4, free   No orders of the defined types were placed in this encounter.  Patient Instructions  General Preventive Care  Most recent routine screening labs: done 01/06/20. Repeat labs in 6 months.   Blood counts good  Testosterone levels up    PSA (prostate) negative   Blood pressure goal 130/80 or less.   Tobacco: don't!   Alcohol: responsible moderation is ok for most adults - if you have concerns about your alcohol intake, please talk to me!   Exercise: as tolerated to reduce risk of cardiovascular disease and diabetes. Strength training will also prevent  osteoporosis.   Mental health: if need for mental health care (medicines, counseling, other), or concerns about moods, please let me know!   Sexual / Reproductive health: if need for STD testing, or if concerns with libido/pain problems, please let me know!  Advanced Directive: Living Will and/or Healthcare Power of Attorney recommended for all adults, regardless of age or health.  Vaccines  Flu vaccine: for almost everyone, every fall.   Shingles vaccine: after age 109 - due for shot #2 of 2  Pneumonia vaccines: after age 13.  Tetanus booster: every 10 years - due 2030  COVID vaccine: THANKS for getting your vaccine! :)  Cancer screenings   Colon cancer screening: for everyone age 4-75. Colonoscopy available for all, many people also qualify for the Cologuard stool test. [facility] phone number: Phone: [phone]   Prostate cancer screening: PSA blood test age 4-71  Lung cancer screening: not needed for non-smokers  Infection screenings  . HIV: recommended screening at least once age 73-65, more often as needed. . Gonorrhea/Chlamydia: screening as needed . Hepatitis C: recommended once for everyone age 9-75 . TB: certain at-risk populations, or depending on work requirements and/or travel history Other . Bone Density Test: recommended for men at age 64, sooner depending on risk factors . Abdominal Aortic Aneurysm: screening with ultrasound recommended once for men age 39-75 who have ever smoked    Instructions sent via MyChart. If MyChart not available, pt  was given option for info via personal e-mail w/ no guarantee of protected health info over unsecured e-mail communication, and MyChart sign-up instructions were sent to patient.   Follow Up Instructions: Return in about 6 months (around 07/11/2020) for RECHECK ROUTINE LABS (TESTOSTERONE, A1C, LIPIDS - ORDERS ARE IN) .    I discussed the assessment and treatment plan with the patient. The patient was provided an  opportunity to ask questions and all were answered. The patient agreed with the plan and demonstrated an understanding of the instructions.   The patient was advised to call back or seek an in-person evaluation if any new concerns, if symptoms worsen or if the condition fails to improve as anticipated.  30 minutes of non-face-to-face time was provided during this encounter.      . . . . . . . . . . . . . Marland Kitchen                   Historical information moved to improve visibility of documentation.  Past Medical History:  Diagnosis Date  . Hyperlipidemia LDL goal < 160 02/04/2012  . Hypertriglyceridemia 02/04/2012  . Lack of concentration 01/27/2012  . Pre-diabetes 02/04/2012  . Thyroid disease    Past Surgical History:  Procedure Laterality Date  . CLOSED REDUCTION RADIAL / ULNAR SHAFT FRACTURE    . VASECTOMY     Social History   Tobacco Use  . Smoking status: Never Smoker  . Smokeless tobacco: Never Used  Substance Use Topics  . Alcohol use: Not Currently    Comment: rarely   family history includes Alcohol abuse in an other family member; Cancer in his father; Colon cancer in an other family member; Hypertension in an other family member; Hypothyroidism in his mother.   Immunization History  Administered Date(s) Administered  . Influenza,inj,Quad PF,6+ Mos 12/23/2018  . PFIZER SARS-COV-2 Vaccination 06/13/2019, 07/06/2019  . Td 06/08/2008  . Tdap 12/23/2018  . Zoster Recombinat (Shingrix) 03/28/2019     Medications: Current Outpatient Medications  Medication Sig Dispense Refill  . atorvastatin (LIPITOR) 40 MG tablet Take 1 tablet (40 mg total) by mouth daily. 90 tablet 3  . ciclopirox (PENLAC) 8 % solution Apply topically at bedtime. Apply over nail and surrounding skin. Apply daily over previous coat. After seven (7) days, may remove with alcohol and continue cycle. 12 mL 3  . hydrocortisone (ANUSOL-HC) 25 MG suppository Place 1  suppository (25 mg total) rectally 2 (two) times daily as needed for hemorrhoids. 12 suppository 2  . NEEDLE, DISP, 18 G 18G X 1" MISC As directed to DRAW Testosterone from vial 25 each 0  . NEEDLE, DISP, 22 G 22G X 1-1/2" MISC As directed to ADMINISTER Testosterone IM injection 25 each 0  . Syringe, Disposable, (SYRINGE LUER LOCK) 3 ML MISC as directed for Testosterone administration 25 each 1  . testosterone cypionate (DEPOTESTOTERONE CYPIONATE) 100 MG/ML injection Inject 2 mLs (200 mg total) into the muscle every 14 (fourteen) days. For IM use only 10 mL 5   No current facility-administered medications for this visit.   Allergies  Allergen Reactions  . Vicodin [Hydrocodone-Acetaminophen]     dizziness  Recent Results (from the past 2160 hour(s))  CBC     Status: None   Collection Time: 01/06/20  1:53 PM  Result Value Ref Range   WBC 9.4 3.8 - 10.8 Thousand/uL   RBC 5.72 4.20 - 5.80 Million/uL   Hemoglobin 17.0 13.2 - 17.1 g/dL   HCT 29.9 38 - 50 %   MCV 86.4 80.0 - 100.0 fL   MCH 29.7 27.0 - 33.0 pg   MCHC 34.4 32.0 - 36.0 g/dL   RDW 24.2 68.3 - 41.9 %   Platelets 214 140 - 400 Thousand/uL   MPV 12.2 7.5 - 12.5 fL  Testosterone     Status: Abnormal   Collection Time: 01/06/20  1:53 PM  Result Value Ref Range   Testosterone 1,004 (H) 250 - 827 ng/dL  PSA, Total with Reflex to PSA, Free     Status: None   Collection Time: 01/06/20  1:53 PM  Result Value Ref Range   PSA, Total 0.4 < OR = 4.0 ng/mL    Comment: The Total PSA value from this assay system is  standardized against the equimolar PSA standard.  The test result will be approximately 20% higher  when compared to the [facility] Total PSA  (Siemens assay). Comparison of serial PSA results  should be interpreted with this fact in mind. Marland Kitchen PSA was performed using the [employer] Coulter  Immunoassay method. Values obtained from different  assay  methods cannot be used interchangeably. PSA  levels, regardless of value, should not be  interpreted as absolute evidence of the presence or  absence of disease.

## 2020-01-24 DIAGNOSIS — R7989 Other specified abnormal findings of blood chemistry: Secondary | ICD-10-CM | POA: Diagnosis not present

## 2020-01-24 DIAGNOSIS — I214 Non-ST elevation (NSTEMI) myocardial infarction: Secondary | ICD-10-CM | POA: Diagnosis not present

## 2020-01-24 DIAGNOSIS — I2510 Atherosclerotic heart disease of native coronary artery without angina pectoris: Secondary | ICD-10-CM | POA: Diagnosis not present

## 2020-01-24 DIAGNOSIS — R0602 Shortness of breath: Secondary | ICD-10-CM | POA: Diagnosis not present

## 2020-01-24 DIAGNOSIS — E291 Testicular hypofunction: Secondary | ICD-10-CM | POA: Diagnosis not present

## 2020-01-24 DIAGNOSIS — Z79899 Other long term (current) drug therapy: Secondary | ICD-10-CM | POA: Diagnosis not present

## 2020-01-24 DIAGNOSIS — E669 Obesity, unspecified: Secondary | ICD-10-CM | POA: Diagnosis not present

## 2020-01-24 DIAGNOSIS — Z791 Long term (current) use of non-steroidal anti-inflammatories (NSAID): Secondary | ICD-10-CM | POA: Diagnosis not present

## 2020-01-24 DIAGNOSIS — Z6833 Body mass index (BMI) 33.0-33.9, adult: Secondary | ICD-10-CM | POA: Diagnosis not present

## 2020-01-24 DIAGNOSIS — R945 Abnormal results of liver function studies: Secondary | ICD-10-CM | POA: Diagnosis not present

## 2020-01-24 DIAGNOSIS — R7401 Elevation of levels of liver transaminase levels: Secondary | ICD-10-CM | POA: Diagnosis not present

## 2020-01-24 DIAGNOSIS — E785 Hyperlipidemia, unspecified: Secondary | ICD-10-CM | POA: Diagnosis not present

## 2020-01-24 DIAGNOSIS — D72829 Elevated white blood cell count, unspecified: Secondary | ICD-10-CM | POA: Diagnosis not present

## 2020-01-24 DIAGNOSIS — I517 Cardiomegaly: Secondary | ICD-10-CM | POA: Diagnosis not present

## 2020-01-24 DIAGNOSIS — I25110 Atherosclerotic heart disease of native coronary artery with unstable angina pectoris: Secondary | ICD-10-CM | POA: Diagnosis not present

## 2020-01-24 DIAGNOSIS — Z79890 Hormone replacement therapy (postmenopausal): Secondary | ICD-10-CM | POA: Diagnosis not present

## 2020-01-24 DIAGNOSIS — R079 Chest pain, unspecified: Secondary | ICD-10-CM | POA: Diagnosis not present

## 2020-01-24 DIAGNOSIS — I2582 Chronic total occlusion of coronary artery: Secondary | ICD-10-CM | POA: Diagnosis not present

## 2020-01-24 DIAGNOSIS — K760 Fatty (change of) liver, not elsewhere classified: Secondary | ICD-10-CM | POA: Diagnosis not present

## 2020-01-24 DIAGNOSIS — E039 Hypothyroidism, unspecified: Secondary | ICD-10-CM | POA: Diagnosis not present

## 2020-01-24 DIAGNOSIS — N281 Cyst of kidney, acquired: Secondary | ICD-10-CM | POA: Diagnosis not present

## 2020-02-13 DIAGNOSIS — R7989 Other specified abnormal findings of blood chemistry: Secondary | ICD-10-CM | POA: Diagnosis not present

## 2020-02-13 DIAGNOSIS — I2510 Atherosclerotic heart disease of native coronary artery without angina pectoris: Secondary | ICD-10-CM | POA: Diagnosis not present

## 2020-02-17 DIAGNOSIS — R7989 Other specified abnormal findings of blood chemistry: Secondary | ICD-10-CM | POA: Diagnosis not present

## 2020-03-15 DIAGNOSIS — E785 Hyperlipidemia, unspecified: Secondary | ICD-10-CM | POA: Diagnosis not present

## 2020-03-15 DIAGNOSIS — R079 Chest pain, unspecified: Secondary | ICD-10-CM | POA: Diagnosis not present

## 2020-03-15 DIAGNOSIS — I2510 Atherosclerotic heart disease of native coronary artery without angina pectoris: Secondary | ICD-10-CM | POA: Diagnosis not present

## 2020-09-18 DIAGNOSIS — I2510 Atherosclerotic heart disease of native coronary artery without angina pectoris: Secondary | ICD-10-CM | POA: Diagnosis not present

## 2020-09-18 DIAGNOSIS — R7989 Other specified abnormal findings of blood chemistry: Secondary | ICD-10-CM | POA: Diagnosis not present

## 2020-10-02 DIAGNOSIS — Z8249 Family history of ischemic heart disease and other diseases of the circulatory system: Secondary | ICD-10-CM | POA: Diagnosis not present

## 2020-10-02 DIAGNOSIS — Z79899 Other long term (current) drug therapy: Secondary | ICD-10-CM | POA: Diagnosis not present

## 2020-10-02 DIAGNOSIS — I2510 Atherosclerotic heart disease of native coronary artery without angina pectoris: Secondary | ICD-10-CM | POA: Diagnosis not present

## 2020-10-02 DIAGNOSIS — Z7902 Long term (current) use of antithrombotics/antiplatelets: Secondary | ICD-10-CM | POA: Diagnosis not present

## 2020-10-02 DIAGNOSIS — I252 Old myocardial infarction: Secondary | ICD-10-CM | POA: Diagnosis not present

## 2020-10-02 DIAGNOSIS — Z7982 Long term (current) use of aspirin: Secondary | ICD-10-CM | POA: Diagnosis not present

## 2020-10-02 DIAGNOSIS — Z955 Presence of coronary angioplasty implant and graft: Secondary | ICD-10-CM | POA: Diagnosis not present

## 2020-10-02 DIAGNOSIS — I25118 Atherosclerotic heart disease of native coronary artery with other forms of angina pectoris: Secondary | ICD-10-CM | POA: Diagnosis not present

## 2020-10-02 DIAGNOSIS — E785 Hyperlipidemia, unspecified: Secondary | ICD-10-CM | POA: Diagnosis not present

## 2020-11-14 ENCOUNTER — Encounter: Payer: Self-pay | Admitting: Family Medicine

## 2021-02-06 IMAGING — CT CT RENAL STONE PROTOCOL
2 of 4 series · 16 of 46 positions shown, 18 images · non-contrast
Comparison: None.

CLINICAL DATA: Bilateral flank pain

EXAM:
CT ABDOMEN AND PELVIS WITHOUT CONTRAST
TECHNIQUE: Multidetector CT imaging of the abdomen and pelvis was performed
following the standard protocol without IV contrast.

[Series 2: axial st · axial · 0.93mm/px · z∈[-570,-76]mm · 13 of 109 slices shown, 15 images]
[im 5/109  soft-tissue]
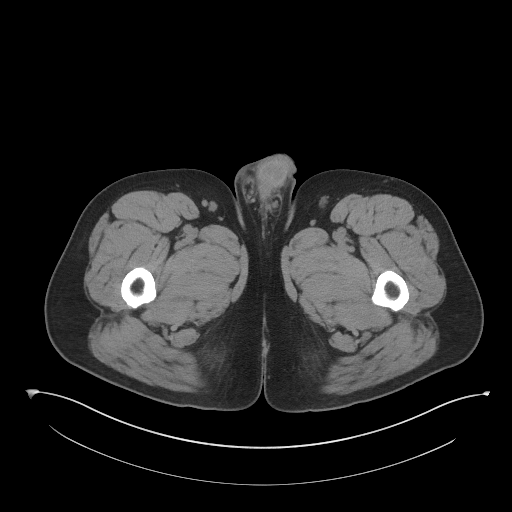
[im 5/109  bone]
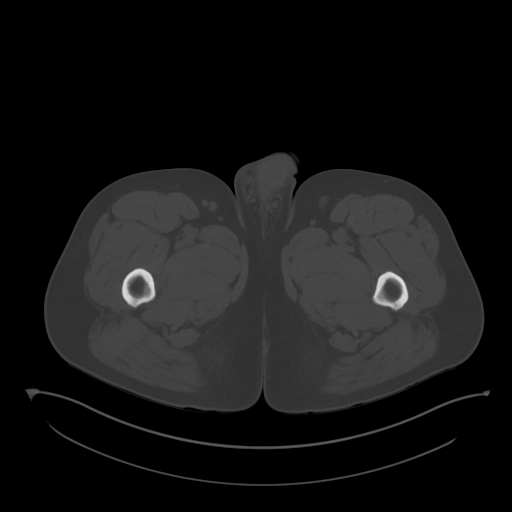
[im 15/109  soft-tissue]
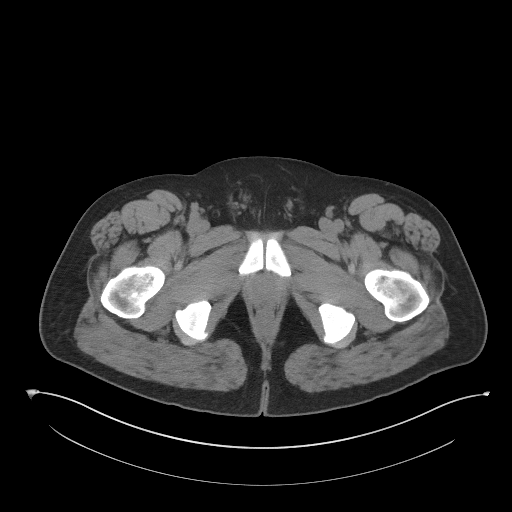
[im 24/109  soft-tissue]
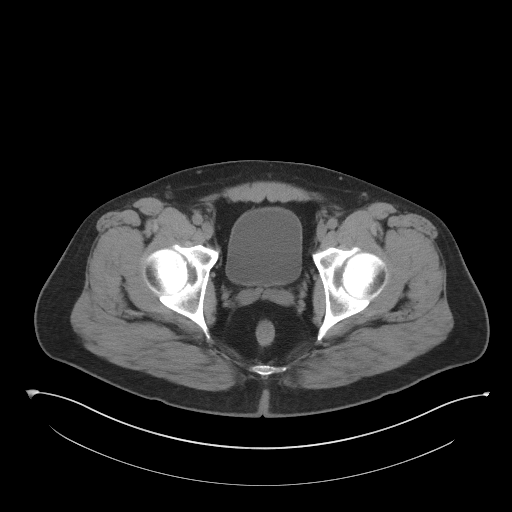
[im 29/109  soft-tissue]
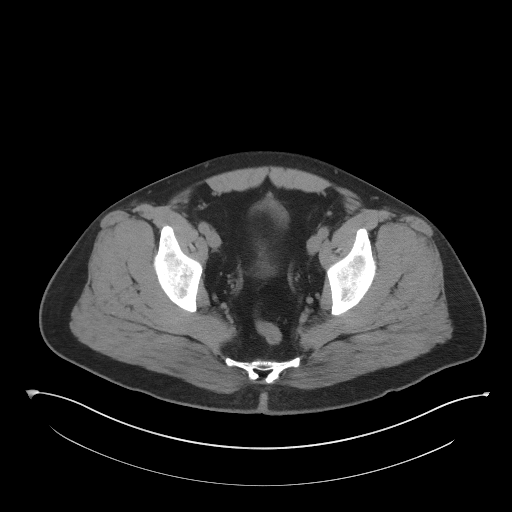
[im 38/109  soft-tissue]
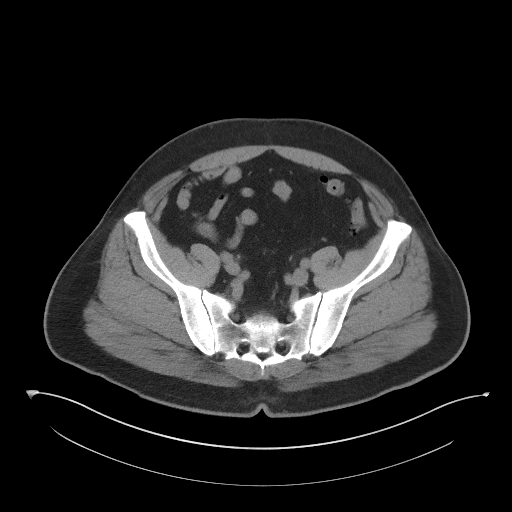
[im 47/109  soft-tissue]
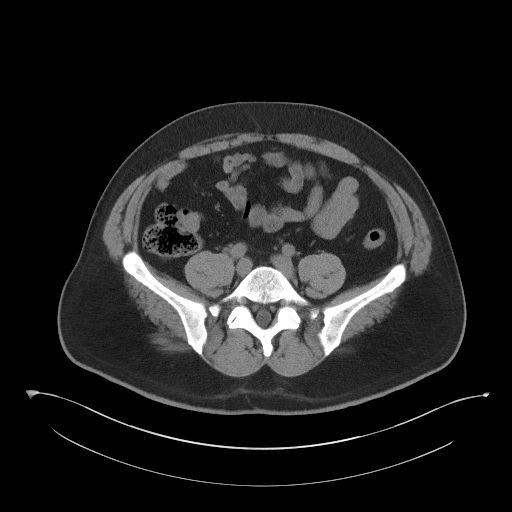
[im 57/109  soft-tissue]
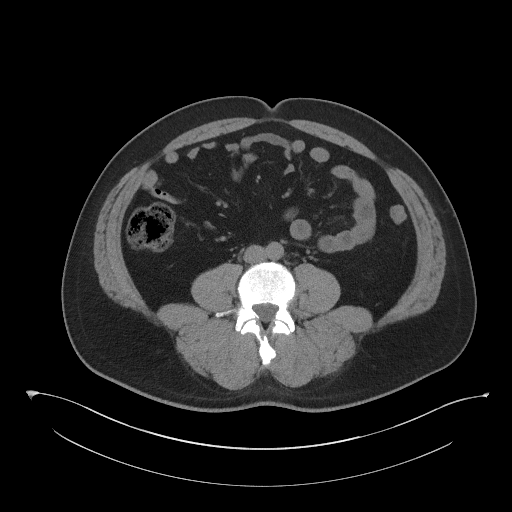
[im 62/109  soft-tissue]
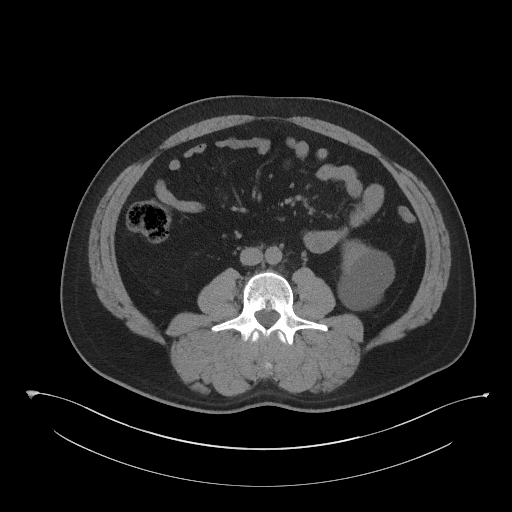
[im 71/109  soft-tissue]
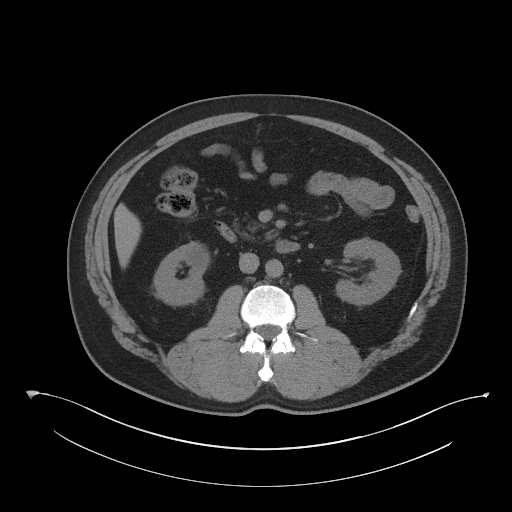
[im 71/109  bone]
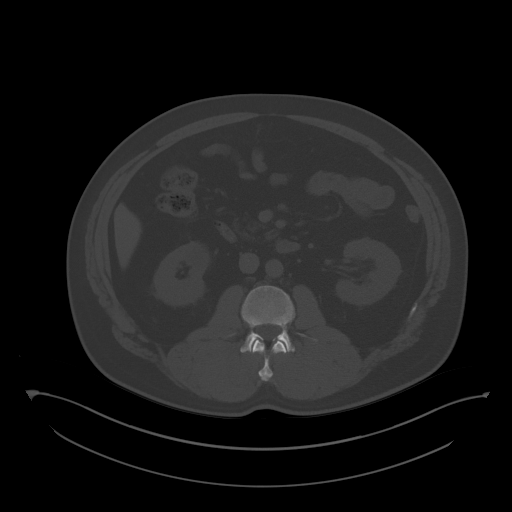
[im 80/109  soft-tissue]
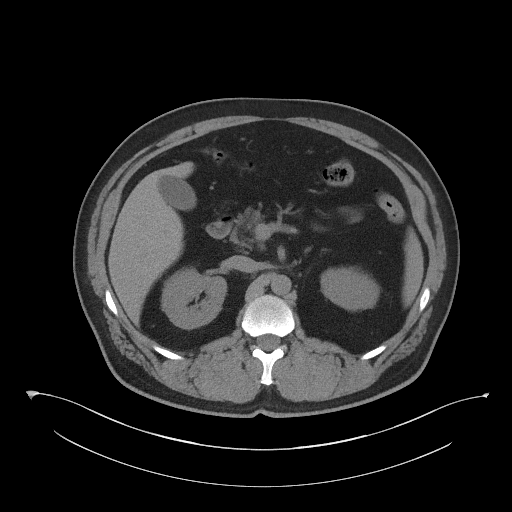
[im 85/109  soft-tissue]
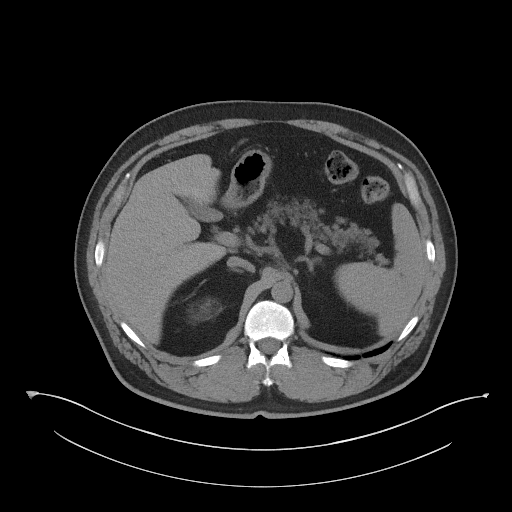
[im 94/109  soft-tissue]
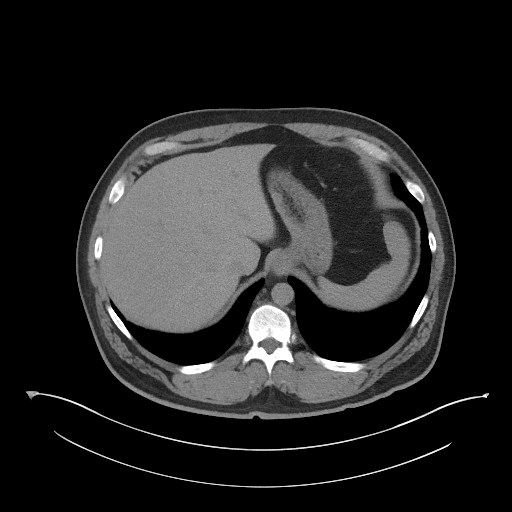
[im 104/109  soft-tissue]
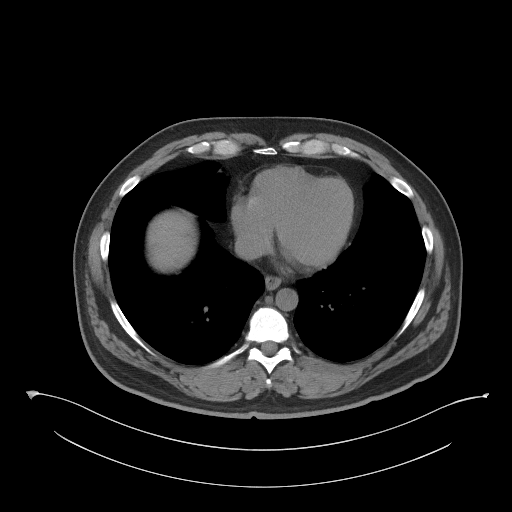

[Series 5: coronal st · coronal · 0.79mm/px · 3 of 102 slices shown]
[im 34/102  soft-tissue]
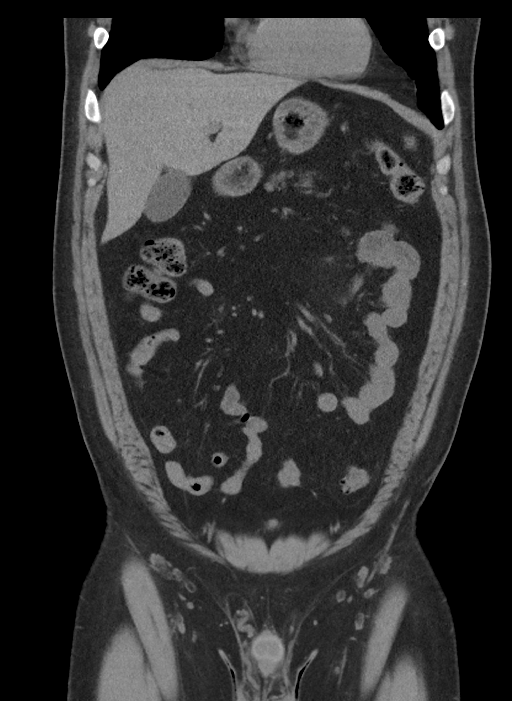
[im 45/102  soft-tissue]
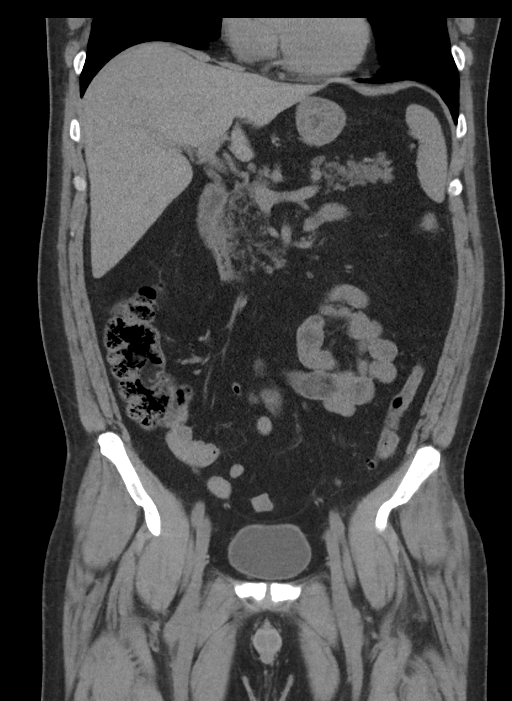
[im 57/102  soft-tissue]
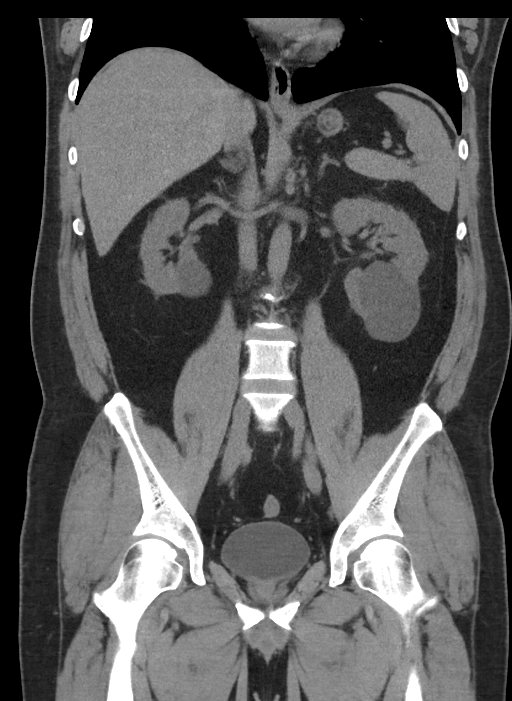

[16 of 46 positions shown; findings below may reference images not displayed]

FINDINGS: Lower chest: No acute abnormality.

Hepatobiliary: No focal liver abnormality is seen. No gallstones,
gallbladder wall thickening, or biliary dilatation.

Pancreas: Unremarkable.

Spleen: Too small to characterize low-attenuation lesion. Otherwise
unremarkable.

Adrenals/Urinary Tract: Bilateral renal cysts. No renal calculi or
hydronephrosis. Ureters are unremarkable. The bladder is
unremarkable for degree of distension.

Stomach/Bowel: Stomach is within normal limits. The bowel is normal
in caliber. Sigmoid diverticulosis. Normal appendix.

Vascular/Lymphatic: No significant vascular findings are present. No
enlarged abdominal or pelvic lymph nodes.

Reproductive: Prostate is unremarkable.

Other: No ascites.  Abdominal wall is unremarkable.

Musculoskeletal: No significant or acute osseous abnormality.
IMPRESSION: No acute abnormality or findings to account for reported symptoms.

## 2021-03-25 DIAGNOSIS — Z955 Presence of coronary angioplasty implant and graft: Secondary | ICD-10-CM | POA: Diagnosis not present

## 2021-03-25 DIAGNOSIS — I2510 Atherosclerotic heart disease of native coronary artery without angina pectoris: Secondary | ICD-10-CM | POA: Diagnosis not present

## 2021-03-25 DIAGNOSIS — I214 Non-ST elevation (NSTEMI) myocardial infarction: Secondary | ICD-10-CM | POA: Diagnosis not present

## 2021-03-25 DIAGNOSIS — R7989 Other specified abnormal findings of blood chemistry: Secondary | ICD-10-CM | POA: Diagnosis not present

## 2021-05-27 DIAGNOSIS — R7989 Other specified abnormal findings of blood chemistry: Secondary | ICD-10-CM | POA: Diagnosis not present

## 2021-05-27 DIAGNOSIS — I2510 Atherosclerotic heart disease of native coronary artery without angina pectoris: Secondary | ICD-10-CM | POA: Diagnosis not present

## 2021-05-27 DIAGNOSIS — I214 Non-ST elevation (NSTEMI) myocardial infarction: Secondary | ICD-10-CM | POA: Diagnosis not present

## 2021-10-14 ENCOUNTER — Ambulatory Visit (INDEPENDENT_AMBULATORY_CARE_PROVIDER_SITE_OTHER): Payer: BC Managed Care – PPO

## 2021-10-14 ENCOUNTER — Ambulatory Visit: Payer: BC Managed Care – PPO | Admitting: Sports Medicine

## 2021-10-14 DIAGNOSIS — M25562 Pain in left knee: Secondary | ICD-10-CM | POA: Diagnosis not present

## 2021-10-14 DIAGNOSIS — Z09 Encounter for follow-up examination after completed treatment for conditions other than malignant neoplasm: Secondary | ICD-10-CM

## 2021-10-14 DIAGNOSIS — M1711 Unilateral primary osteoarthritis, right knee: Secondary | ICD-10-CM | POA: Diagnosis not present

## 2021-10-14 DIAGNOSIS — M1712 Unilateral primary osteoarthritis, left knee: Secondary | ICD-10-CM | POA: Insufficient documentation

## 2021-10-14 MED ORDER — MELOXICAM 15 MG PO TABS: ORAL_TABLET | ORAL | 3 refills | Status: AC

## 2021-10-14 NOTE — Progress Notes (Signed)
    Procedures performed today:    None.  Independent interpretation of notes and tests performed by another provider:   None.  Brief History, Exam, Impression, and Recommendations:    Left knee pain Mehkai is a very pleasant 51 year old male, has had several weeks of increasing pain left knee lateral joint line, no trauma, no mechanical symptoms. He does have moderate gelling. He does have tenderness at lateral joint line, negative McMurray's sign, no pain with terminal flexion, only minimal effusion. Ligaments are stable. Suspect osteoarthritis, we explained the anatomy, pathophysiology, adding x-rays, meloxicam, home conditioning, return to see me in 6 weeks, injection +/- MRI if not better.    ____________________________________________ Ihor Austin. Benjamin Stain, M.D., ABFM., CAQSM., AME. Primary Care and Sports Medicine [facility]  Adjunct Professor of Family Medicine  [place] [employer]  Restaurant manager, fast food

## 2021-10-14 NOTE — Assessment & Plan Note (Signed)
Ricky Ortiz is a very pleasant 51 year old male, has had several weeks of increasing pain left knee lateral joint line, no trauma, no mechanical symptoms. He does have moderate gelling. He does have tenderness at lateral joint line, negative McMurray's sign, no pain with terminal flexion, only minimal effusion. Ligaments are stable. Suspect osteoarthritis, we explained the anatomy, pathophysiology, adding x-rays, meloxicam, home conditioning, return to see me in 6 weeks, injection +/- MRI if not better.

## 2021-10-16 DIAGNOSIS — I2510 Atherosclerotic heart disease of native coronary artery without angina pectoris: Secondary | ICD-10-CM | POA: Diagnosis not present

## 2021-10-16 DIAGNOSIS — E785 Hyperlipidemia, unspecified: Secondary | ICD-10-CM | POA: Diagnosis not present

## 2021-10-16 DIAGNOSIS — R7989 Other specified abnormal findings of blood chemistry: Secondary | ICD-10-CM | POA: Diagnosis not present

## 2021-11-19 DIAGNOSIS — L820 Inflamed seborrheic keratosis: Secondary | ICD-10-CM | POA: Diagnosis not present

## 2021-11-19 DIAGNOSIS — D2271 Melanocytic nevi of right lower limb, including hip: Secondary | ICD-10-CM | POA: Diagnosis not present

## 2021-11-19 DIAGNOSIS — D2261 Melanocytic nevi of right upper limb, including shoulder: Secondary | ICD-10-CM | POA: Diagnosis not present

## 2021-11-19 DIAGNOSIS — D225 Melanocytic nevi of trunk: Secondary | ICD-10-CM | POA: Diagnosis not present

## 2021-11-19 DIAGNOSIS — D485 Neoplasm of uncertain behavior of skin: Secondary | ICD-10-CM | POA: Diagnosis not present

## 2021-11-19 DIAGNOSIS — L570 Actinic keratosis: Secondary | ICD-10-CM | POA: Diagnosis not present

## 2021-11-19 DIAGNOSIS — D2262 Melanocytic nevi of left upper limb, including shoulder: Secondary | ICD-10-CM | POA: Diagnosis not present

## 2021-11-25 ENCOUNTER — Ambulatory Visit: Payer: BC Managed Care – PPO | Admitting: Sports Medicine

## 2021-11-25 DIAGNOSIS — M1712 Unilateral primary osteoarthritis, left knee: Secondary | ICD-10-CM

## 2021-11-25 NOTE — Progress Notes (Signed)
    Procedures performed today:    None.  Independent interpretation of notes and tests performed by another provider:   None.  Brief History, Exam, Impression, and Recommendations:    Primary osteoarthritis of left knee This is a very pleasant 51 year old male, chronic left knee pain, he improved considerably with meloxicam, home conditioning, he feels as though he could get a little more diligent with this. X-rays did show arthritis, returns today doing a lot better, does have some discomfort with squatting and getting up off the floor but can live with it, he understands that if he decides that he cannot live with it or the medicine stops working we will proceed with an injection. Return as needed.    ____________________________________________ Ihor Austin. Benjamin Stain, M.D., ABFM., CAQSM., AME. Primary Care and Sports Medicine [facility]  Adjunct Professor of Family Medicine  [employer]  Restaurant manager, fast food

## 2021-11-25 NOTE — Assessment & Plan Note (Signed)
This is a very pleasant 51 year old male, chronic left knee pain, he improved considerably with meloxicam, home conditioning, he feels as though he could get a little more diligent with this. X-rays did show arthritis, returns today doing a lot better, does have some discomfort with squatting and getting up off the floor but can live with it, he understands that if he decides that he cannot live with it or the medicine stops working we will proceed with an injection. Return as needed.

## 2022-06-16 DEATH — deceased

## 2022-11-16 ENCOUNTER — Ambulatory Visit
Admission: RE | Admit: 2022-11-16 | Discharge: 2022-11-16 | Disposition: A | Discharge status: Home / Self Care | Payer: BC Managed Care – PPO | Source: Ambulatory Visit | Attending: Family Medicine | Admitting: Family Medicine

## 2022-11-16 VITALS — BP 119/78 | HR 90 | Temp 98.2°F | Resp 16 | Ht 70.0 in | Wt 245.0 lb

## 2022-11-16 DIAGNOSIS — J019 Acute sinusitis, unspecified: Secondary | ICD-10-CM | POA: Diagnosis not present

## 2022-11-16 MED ORDER — DOXYCYCLINE HYCLATE 100 MG PO CAPS: 100.0000 mg | ORAL_CAPSULE | Freq: Two times a day (BID) | ORAL | 0 refills | Status: DC

## 2022-11-16 MED ORDER — PREDNISONE 50 MG PO TABS: ORAL_TABLET | ORAL | 0 refills | Status: DC

## 2022-11-16 NOTE — ED Provider Notes (Signed)
[name]    CSN: [id number] Arrival date & time: 11/16/22  1112      History   Chief Complaint Chief Complaint  Patient presents with   Nasal Congestion    Sinus infection that is not clearing up. - Entered by patient    HPI Ricky Ortiz is a 52 y.o. male.   Patient has been sick for over a month.  He states that he was seen at a different urgent care center a couple weeks ago and was given Augmentin.  He is using Nettie pot.  He has tried some over-the-counter medicine.  In spite of this he still has sinus pressure and pain, difficulty breathing through the left side of his nose, and sinus drainage that is at times yellow.  He feels like the infection never cleared up.  Does not usually have sinus infections or allergies.  The urgent care center did strep and COVID testing    Past Medical History:  Diagnosis Date   Hyperlipidemia LDL goal < 160 02/04/2012   Hypertriglyceridemia 02/04/2012   Lack of concentration 01/27/2012   Pre-diabetes 02/04/2012   Thyroid disease     Patient Active Problem List   Diagnosis Date Noted   Primary osteoarthritis of left knee 10/14/2021   Elevated blood pressure reading 12/23/2018   Onychomycosis 11/21/2016   Fatigue 11/21/2016   Graves disease 01/31/2016   Annual physical exam 01/14/2016   Right hand tendonitis 09/04/2015   Closed fracture of fifth metacarpal bone of right hand 12/22/2014   Prediabetes 02/04/2012   Hypertriglyceridemia 02/04/2012   Lack of concentration 01/27/2012    Past Surgical History:  Procedure Laterality Date   CLOSED REDUCTION RADIAL / ULNAR SHAFT FRACTURE     VASECTOMY         Home Medications    Prior to Admission medications   Medication Sig Start Date End Date Taking? Authorizing Provider  aspirin 81 MG chewable tablet CHEW AND SWALLOW ONE TABLET BY MOUTH DAILY 01/14/20  Yes [provider]  atorvastatin (LIPITOR) 80 MG tablet Take by mouth. 10/14/18  Yes [provider]  clopidogrel (PLAVIX) 75 MG tablet  03/25/21  Yes [provider]  doxycycline (VIBRAMYCIN) 100 MG capsule Take 1 capsule (100 mg total) by mouth 2 (two) times daily. 11/16/22  Yes Eustace Moore, MD  meloxicam (MOBIC) 15 MG tablet One tab PO every 24 hours with a meal for 2 weeks, then once every 24 hours prn pain. 10/14/21  Yes Monica Becton, MD  metoprolol tartrate (LOPRESSOR) 25 MG tablet Take by mouth. 01/14/20  Yes [provider]  predniSONE (DELTASONE) 50 MG tablet Take once a day for 5 days.  Take with food 11/16/22  Yes Eustace Moore, MD    Family History Family History  Problem Relation Age of Onset   Alcohol abuse Other        grandparents   Colon cancer Other        grandfather   Hypertension Other        grandfather   Hypothyroidism Mother    Cancer Father        brain    Social History Social History   Tobacco Use   Smoking status: Never   Smokeless tobacco: Never  Vaping Use   Vaping status: Never Used  Substance Use Topics   Alcohol use: Not Currently    Comment: rarely   Drug use: No     Allergies   Vicodin [hydrocodone-acetaminophen]  Review of Systems Review of Systems See HPI  Physical Exam Triage Vital Signs ED Triage Vitals  Encounter Vitals Group     BP 11/16/22 1127 119/78     Systolic BP Percentile --      Diastolic BP Percentile --      Pulse Rate 11/16/22 1127 90     Resp 11/16/22 1127 16     Temp 11/16/22 1127 98.2 F (36.8 C)     Temp Source 11/16/22 1127 Oral     SpO2 11/16/22 1127 95 %     Weight 11/16/22 1129 245 lb (111.1 kg)     Height 11/16/22 1129 5\' 10"  (1.778 m)     Head Circumference --      Peak Flow --      Pain Score 11/16/22 1128 1     Pain Loc --      Pain Education --      Exclude from Growth Chart --    No data found.  Updated Vital Signs BP 119/78 (BP Location: Left Arm)   Pulse 90   Temp 98.2 F (36.8 C) (Oral)   Resp 16   Ht 5\' 10"  (1.778 m)   Wt  111.1 kg   SpO2 95%   BMI 35.15 kg/m       Physical Exam Constitutional:      General: He is not in acute distress.    Appearance: He is well-developed.  HENT:     Head: Normocephalic and atraumatic.     Right Ear: Tympanic membrane and ear canal normal.     Left Ear: Tympanic membrane and ear canal normal.     Nose: Congestion and rhinorrhea present.     Mouth/Throat:     Pharynx: Posterior oropharyngeal erythema present.  Eyes:     Conjunctiva/sclera: Conjunctivae normal.     Pupils: Pupils are equal, round, and reactive to light.  Cardiovascular:     Rate and Rhythm: Normal rate and regular rhythm.     Heart sounds: Normal heart sounds.  Pulmonary:     Effort: Pulmonary effort is normal. No respiratory distress.     Breath sounds: Normal breath sounds.  Abdominal:     General: There is no distension.     Palpations: Abdomen is soft.  Musculoskeletal:        General: Normal range of motion.     Cervical back: Normal range of motion.  Lymphadenopathy:     Cervical: Cervical adenopathy present.  Skin:    General: Skin is warm and dry.  Neurological:     Mental Status: He is alert.      UC Treatments / Results  Labs (all labs ordered are listed, but only abnormal results are displayed) Labs Reviewed - No data to display  EKG   Radiology No results found.  Procedures Procedures (including critical care time)  Medications Ordered in UC Medications - No data to display  Initial Impression / Assessment and Plan / UC Course  I have reviewed the triage vital signs and the nursing notes.  Pertinent labs & imaging results that were available during my care of the patient were reviewed by me and considered in my medical decision making (see chart for details).      Final Clinical Impressions(s) / UC Diagnoses   Final diagnoses:  Acute sinusitis with symptoms greater than 10 days     Discharge Instructions      Take the antibiotic 2 times a day.  It  is  important to take with food Take prednisone once a day for 5 days Continue to drink lots of fluids Add Flonase once a day.  Use this until your symptoms are completely improved Consider Afrin if you have congestion or ear problems when flying   ED Prescriptions     Medication Sig Dispense Auth. Provider   doxycycline (VIBRAMYCIN) 100 MG capsule Take 1 capsule (100 mg total) by mouth 2 (two) times daily. 20 capsule Eustace Moore, MD   predniSONE (DELTASONE) 50 MG tablet Take once a day for 5 days.  Take with food 5 tablet Eustace Moore, MD      PDMP not reviewed this encounter.   Eustace Moore, MD 11/16/22 [phone]

## 2022-11-16 NOTE — ED Triage Notes (Signed)
Patient c/o nasal congestion, drainage x 4 weeks.  Patient was seen x 2 weeks ago and dx w/sinus infection.  Patient was given Amoxil 875mg  x bid #20.  Patient has been using a nettipot and able to removed some mucous.  Still congested more on his left side.  Completed antbs.

## 2022-11-16 NOTE — Discharge Instructions (Addendum)
Take the antibiotic 2 times a day.  It is important to take with food Take prednisone once a day for 5 days Continue to drink lots of fluids Add Flonase once a day.  Use this until your symptoms are completely improved Consider Afrin if you have congestion or ear problems when flying

## 2023-02-02 DIAGNOSIS — E559 Vitamin D deficiency, unspecified: Secondary | ICD-10-CM | POA: Diagnosis not present

## 2023-02-02 DIAGNOSIS — E782 Mixed hyperlipidemia: Secondary | ICD-10-CM | POA: Diagnosis not present

## 2023-02-02 DIAGNOSIS — R5383 Other fatigue: Secondary | ICD-10-CM | POA: Diagnosis not present

## 2023-02-02 DIAGNOSIS — E119 Type 2 diabetes mellitus without complications: Secondary | ICD-10-CM | POA: Diagnosis not present

## 2023-09-19 ENCOUNTER — Ambulatory Visit
Admission: EM | Admit: 2023-09-19 | Discharge: 2023-09-19 | Disposition: A | Discharge status: Home / Self Care | Attending: Family Medicine | Admitting: Family Medicine

## 2023-09-19 ENCOUNTER — Encounter: Payer: Self-pay | Admitting: Emergency Medicine

## 2023-09-19 DIAGNOSIS — L237 Allergic contact dermatitis due to plants, except food: Secondary | ICD-10-CM

## 2023-09-19 MED ORDER — DEXAMETHASONE SODIUM PHOSPHATE 10 MG/ML IJ SOLN
10.0000 mg | Freq: Once | INTRAMUSCULAR | Status: AC
  Administered 2023-09-19: 10 mg via INTRAMUSCULAR

## 2023-09-19 MED ORDER — CEPHALEXIN 500 MG PO CAPS: 500.0000 mg | ORAL_CAPSULE | Freq: Two times a day (BID) | ORAL | 0 refills | Status: AC

## 2023-09-19 MED ORDER — PREDNISONE 10 MG (21) PO TBPK
ORAL_TABLET | Freq: Every day | ORAL | 0 refills | Status: AC
Start: 2023-09-19 — End: ?

## 2023-09-19 NOTE — Discharge Instructions (Signed)
 Take the prednisone  as directed.  Start the oral prednisone  tomorrow ( received a shot today) Keflex  (cephalexin ) is an antibiotic.  Take twice a day for 5 days to prevent infection Keep cool May use the cortisone cream on the rash.  I recommend against using a Benadryl topical product May take antihistamines by mouth for the itching and discomfort.  May take Claritin or Zyrtec during the day and Benadryl at night Elevate the arm to reduce swelling All of the steroid and prednisone  medicines can increase your blood sugar.  Be especially careful with your carbohydrate intake

## 2023-09-19 NOTE — ED Provider Notes (Signed)
 [facility]    CSN: [id number] Arrival date & time: 09/19/23  1106      History   Chief Complaint Chief Complaint  Patient presents with   Rash    HPI Ricky Ortiz is a 53 y.o. male.   HPI  Patient states he is very allergic to poison ivy.  He did some yard work 4 days ago and has a progressively uncomfortable rash on his left forearm, to a lesser degree on the right forearm.  He has erythema, soft tissue swelling, weeping, vesicles.  He has been using over-the-counter poison ivy products without improvement  Past Medical History:  Diagnosis Date   Hyperlipidemia LDL goal < 160 02/04/2012   Hypertriglyceridemia 02/04/2012   Lack of concentration 01/27/2012   Pre-diabetes 02/04/2012   Thyroid  disease     Patient Active Problem List   Diagnosis Date Noted   Primary osteoarthritis of left knee 10/14/2021   Elevated blood pressure reading 12/23/2018   Onychomycosis 11/21/2016   Fatigue 11/21/2016   Graves disease 01/31/2016   Annual physical exam 01/14/2016   Right hand tendonitis 09/04/2015   Closed fracture of fifth metacarpal bone of right hand 12/22/2014   Prediabetes 02/04/2012   Hypertriglyceridemia 02/04/2012   Lack of concentration 01/27/2012    Past Surgical History:  Procedure Laterality Date   CLOSED REDUCTION RADIAL / ULNAR SHAFT FRACTURE     VASECTOMY         Home Medications    Prior to Admission medications   Medication Sig Start Date End Date Taking? Authorizing Provider  aspirin 81 MG chewable tablet CHEW AND SWALLOW ONE TABLET BY MOUTH DAILY 01/14/20  Yes [provider]  atorvastatin  (LIPITOR) 80 MG tablet Take by mouth. 10/14/18  Yes [provider]  cephALEXin  (KEFLEX ) 500 MG capsule Take 1 capsule (500 mg total) by mouth 2 (two) times daily. 09/19/23  Yes Maranda Jamee Jacob, MD  Cyanocobalamin (PHYSICIANS EZ USE B-12) 1000 MCG/ML KIT Inject 1 mL into the muscle once a day for 4 days then once a week for 4 weeks  thereafter 02/03/23  Yes [provider]  meloxicam  (MOBIC ) 15 MG tablet One tab PO every 24 hours with a meal for 2 weeks, then once every 24 hours prn pain. 10/14/21  Yes Curtis Debby PARAS, MD  metFORMIN (GLUCOPHAGE) 500 MG tablet Take 500 mg by mouth daily with breakfast.   Yes [provider]  metoprolol tartrate (LOPRESSOR) 25 MG tablet Take by mouth. 01/14/20  Yes [provider]  predniSONE  (STERAPRED UNI-PAK 21 TAB) 10 MG (21) TBPK tablet Take by mouth daily. Take 6 tabs by mouth daily  for 2 days, then 5 tabs for 2 days, then 4 tabs for 2 days, then 3 tabs for 2 days, 2 tabs for 2 days, then 1 tab by mouth daily for 2 days 09/19/23  Yes Maranda Jamee Jacob, MD  Vitamin D , Ergocalciferol , (DRISDOL ) 1.25 MG (50000 UNIT) CAPS capsule Take 50,000 Units by mouth once a week. 02/03/23  Yes [provider]    Family History Family History  Problem Relation Age of Onset   Alcohol abuse Other        grandparents   Colon cancer Other        grandfather   Hypertension Other        grandfather   Hypothyroidism Mother    Cancer Father        brain    Social History Social History   Tobacco  Use   Smoking status: Never   Smokeless tobacco: Never  Vaping Use   Vaping status: Never Used  Substance Use Topics   Alcohol use: Not Currently    Comment: rarely   Drug use: No     Allergies   Vicodin [hydrocodone-acetaminophen]   Review of Systems Review of Systems See HPI  Physical Exam Triage Vital Signs ED Triage Vitals  Encounter Vitals Group     BP 09/19/23 1137 104/69     Girls Systolic BP Percentile --      Girls Diastolic BP Percentile --      Boys Systolic BP Percentile --      Boys Diastolic BP Percentile --      Pulse Rate 09/19/23 1137 83     Resp 09/19/23 1137 18     Temp 09/19/23 1137 98.1 F (36.7 C)     Temp Source 09/19/23 1137 Oral     SpO2 09/19/23 1137 94 %     Weight 09/19/23 1138 217 lb (98.4 kg)     Height  09/19/23 1138 5' 10 (1.778 m)     Head Circumference --      Peak Flow --      Pain Score 09/19/23 1138 2     Pain Loc --      Pain Education --      Exclude from Growth Chart --    No data found.  Updated Vital Signs BP 104/69 (BP Location: Right Arm)   Pulse 83   Temp 98.1 F (36.7 C) (Oral)   Resp 18   Ht 5' 10 (1.778 m)   Wt 98.4 kg   SpO2 94%   BMI 31.14 kg/m      Physical Exam Constitutional:      General: He is not in acute distress.    Appearance: He is well-developed and normal weight.  HENT:     Head: Normocephalic and atraumatic.  Eyes:     Conjunctiva/sclera: Conjunctivae normal.     Pupils: Pupils are equal, round, and reactive to light.  Cardiovascular:     Rate and Rhythm: Normal rate.  Pulmonary:     Effort: Pulmonary effort is normal. No respiratory distress.  Abdominal:     General: There is no distension.     Palpations: Abdomen is soft.  Musculoskeletal:        General: Normal range of motion.     Cervical back: Normal range of motion.  Skin:    General: Skin is warm and dry.     Findings: Rash present.     Comments: The left forearm had soft tissue swelling in the mid forearm on the volar surface, and erythema circumferentially.  There were multiple patches of weeping clusters of vesicles.  Few patches present on the right forearm  Neurological:     Mental Status: He is alert.      UC Treatments / Results  Labs (all labs ordered are listed, but only abnormal results are displayed) Labs Reviewed - No data to display  EKG   Radiology No results found.  Procedures Procedures (including critical care time)  Medications Ordered in UC Medications  dexamethasone  (DECADRON ) injection 10 mg (10 mg Intramuscular Given 09/19/23 1154)    Initial Impression / Assessment and Plan / UC Course  I have reviewed the triage vital signs and the nursing notes.  Pertinent labs & imaging results that were available during my care of the patient  were reviewed by me and considered  in my medical decision making (see chart for details).     Allergic contact dermatitis to poison ivy.  With soft tissue swelling redness and pain will add Keflex  out of concern for possible infection Final Clinical Impressions(s) / UC Diagnoses   Final diagnoses:  Allergic contact dermatitis due to plant     Discharge Instructions      Take the prednisone  as directed.  Start the oral prednisone  tomorrow ( received a shot today) Keflex  (cephalexin ) is an antibiotic.  Take twice a day for 5 days to prevent infection Keep cool May use the cortisone cream on the rash.  I recommend against using a Benadryl topical product May take antihistamines by mouth for the itching and discomfort.  May take Claritin or Zyrtec during the day and Benadryl at night Elevate the arm to reduce swelling All of the steroid and prednisone  medicines can increase your blood sugar.  Be especially careful with your carbohydrate intake   ED Prescriptions     Medication Sig Dispense Auth. Provider   predniSONE  (STERAPRED UNI-PAK 21 TAB) 10 MG (21) TBPK tablet Take by mouth daily. Take 6 tabs by mouth daily  for 2 days, then 5 tabs for 2 days, then 4 tabs for 2 days, then 3 tabs for 2 days, 2 tabs for 2 days, then 1 tab by mouth daily for 2 days 42 tablet Maranda Jamee Jacob, MD   cephALEXin  (KEFLEX ) 500 MG capsule Take 1 capsule (500 mg total) by mouth 2 (two) times daily. 10 capsule Maranda Jamee Jacob, MD      PDMP not reviewed this encounter.   Maranda Jamee Jacob, MD 09/19/23 [phone]

## 2023-09-19 NOTE — ED Triage Notes (Signed)
 Patient c/o possible poison ivy on his left forearm x 4 days.  Patient noticed on Tuesday after chopping trees down.  The area is red and itchy.  Patient has taken Benadryl and otc poision ivy shampoo, aloe, calamine lotion, solacaine.

## 2023-11-17 ENCOUNTER — Encounter: Payer: Self-pay | Admitting: Sports Medicine
